# Patient Record
Sex: Female | Born: 1970 | Race: White | Hispanic: No | Marital: Single | Smoking: Former smoker
Health system: Southern US, Community
[De-identification: ages and names within clinical notes are randomized; demographics above are authoritative.]

## PROBLEM LIST (undated history)

## (undated) DIAGNOSIS — F32A Depression, unspecified: Secondary | ICD-10-CM

## (undated) DIAGNOSIS — I10 Essential (primary) hypertension: Secondary | ICD-10-CM

## (undated) DIAGNOSIS — E119 Type 2 diabetes mellitus without complications: Secondary | ICD-10-CM

## (undated) DIAGNOSIS — F329 Major depressive disorder, single episode, unspecified: Secondary | ICD-10-CM

## (undated) DIAGNOSIS — E7800 Pure hypercholesterolemia, unspecified: Secondary | ICD-10-CM

## (undated) HISTORY — PX: CHOLECYSTECTOMY: SHX55

## (undated) HISTORY — PX: HERNIA REPAIR: SHX51

---

## 2006-07-06 ENCOUNTER — Emergency Department: Payer: Self-pay

## 2008-07-10 ENCOUNTER — Emergency Department: Payer: Self-pay | Admitting: Internal Medicine

## 2009-01-18 ENCOUNTER — Emergency Department: Payer: Self-pay | Admitting: Emergency Medicine

## 2009-03-12 ENCOUNTER — Emergency Department: Payer: Self-pay | Admitting: Emergency Medicine

## 2009-05-21 ENCOUNTER — Emergency Department: Payer: Self-pay | Admitting: Unknown Physician Specialty

## 2009-07-04 ENCOUNTER — Emergency Department: Payer: Self-pay | Admitting: Emergency Medicine

## 2009-10-29 ENCOUNTER — Emergency Department: Payer: Self-pay | Admitting: Emergency Medicine

## 2009-12-01 ENCOUNTER — Ambulatory Visit: Payer: Self-pay | Admitting: Family Medicine

## 2010-01-13 ENCOUNTER — Ambulatory Visit: Payer: Self-pay | Admitting: Emergency Medicine

## 2010-03-25 ENCOUNTER — Ambulatory Visit: Payer: Self-pay | Admitting: Cardiovascular Disease

## 2010-11-24 ENCOUNTER — Emergency Department: Payer: Self-pay | Admitting: Emergency Medicine

## 2011-01-08 ENCOUNTER — Emergency Department: Payer: Self-pay | Admitting: Emergency Medicine

## 2011-05-10 ENCOUNTER — Emergency Department: Payer: Self-pay | Admitting: Unknown Physician Specialty

## 2011-05-11 LAB — URINALYSIS, COMPLETE
Bacteria: NONE SEEN
Blood: NEGATIVE
Glucose,UR: NEGATIVE mg/dL (ref 0–75)
Leukocyte Esterase: NEGATIVE
Nitrite: NEGATIVE
Protein: NEGATIVE
RBC,UR: 1 /HPF (ref 0–5)
Specific Gravity: 1.03 (ref 1.003–1.030)
Squamous Epithelial: 1
WBC UR: <1 /HPF (ref 0–5)

## 2011-05-11 LAB — CBC
HCT: 38.7 % (ref 35.0–47.0)
MCV: 70 fL — ABNORMAL LOW (ref 80–100)
Platelet: 325 10*3/uL (ref 150–440)
RBC: 5.54 10*6/uL — ABNORMAL HIGH (ref 3.80–5.20)
RDW: 19.9 % — ABNORMAL HIGH (ref 11.5–14.5)

## 2011-05-11 LAB — COMPREHENSIVE METABOLIC PANEL
Anion Gap: 14 (ref 7–16)
Calcium, Total: 8.8 mg/dL (ref 8.5–10.1)
Creatinine: 0.56 mg/dL — ABNORMAL LOW (ref 0.60–1.30)
EGFR (African American): >60
EGFR (Non-African Amer.): >60
Glucose: 94 mg/dL (ref 65–99)
Potassium: 4 mmol/L (ref 3.5–5.1)
SGOT(AST): 24 U/L (ref 15–37)
SGPT (ALT): 18 U/L

## 2011-05-11 LAB — TROPONIN I: Troponin-I: <0.02 ng/mL

## 2011-05-11 LAB — MAGNESIUM: Magnesium: 1.3 mg/dL — ABNORMAL LOW

## 2011-10-24 ENCOUNTER — Emergency Department: Payer: Self-pay | Admitting: Emergency Medicine

## 2011-10-24 LAB — BASIC METABOLIC PANEL
Anion Gap: 10 (ref 7–16)
BUN: 14 mg/dL (ref 7–18)
Chloride: 108 mmol/L — ABNORMAL HIGH (ref 98–107)
Co2: 22 mmol/L (ref 21–32)
Creatinine: 0.61 mg/dL (ref 0.60–1.30)
EGFR (African American): >60
EGFR (Non-African Amer.): >60
Glucose: 126 mg/dL — ABNORMAL HIGH (ref 65–99)
Osmolality: 281 (ref 275–301)
Potassium: 3.8 mmol/L (ref 3.5–5.1)
Sodium: 140 mmol/L (ref 136–145)

## 2011-10-24 LAB — CBC
HGB: 11.4 g/dL — ABNORMAL LOW (ref 12.0–16.0)
MCH: 24.7 pg — ABNORMAL LOW (ref 26.0–34.0)
MCHC: 32.7 g/dL (ref 32.0–36.0)
MCV: 75 fL — ABNORMAL LOW (ref 80–100)
RBC: 4.6 10*6/uL (ref 3.80–5.20)
RDW: 17.2 % — ABNORMAL HIGH (ref 11.5–14.5)
WBC: 12 10*3/uL — ABNORMAL HIGH (ref 3.6–11.0)

## 2011-10-24 LAB — TROPONIN I: Troponin-I: <0.02 ng/mL

## 2011-10-24 LAB — CK TOTAL AND CKMB (NOT AT ARMC): CK, Total: 105 U/L (ref 21–215)

## 2013-07-10 ENCOUNTER — Emergency Department: Payer: Self-pay | Admitting: Emergency Medicine

## 2013-07-10 LAB — CBC WITH DIFFERENTIAL/PLATELET
Basophil #: 0 10*3/uL (ref 0.0–0.1)
Basophil %: 0.4 %
EOS ABS: 0.3 10*3/uL (ref 0.0–0.7)
Eosinophil %: 2.4 %
HCT: 36 % (ref 35.0–47.0)
HGB: 11.4 g/dL — ABNORMAL LOW (ref 12.0–16.0)
Lymphocyte #: 2.4 10*3/uL (ref 1.0–3.6)
Lymphocyte %: 23 %
MCH: 23.9 pg — ABNORMAL LOW (ref 26.0–34.0)
MCHC: 31.6 g/dL — ABNORMAL LOW (ref 32.0–36.0)
MCV: 76 fL — AB (ref 80–100)
Monocyte #: 0.5 x10 3/mm (ref 0.2–0.9)
Monocyte %: 4.4 %
Neutrophil #: 7.3 10*3/uL — ABNORMAL HIGH (ref 1.4–6.5)
Neutrophil %: 69.8 %
Platelet: 358 10*3/uL (ref 150–440)
RBC: 4.75 10*6/uL (ref 3.80–5.20)
RDW: 18.5 % — AB (ref 11.5–14.5)
WBC: 10.5 10*3/uL (ref 3.6–11.0)

## 2013-07-10 LAB — COMPREHENSIVE METABOLIC PANEL
ALK PHOS: 65 U/L
AST: 12 U/L — AB (ref 15–37)
Albumin: 3.2 g/dL — ABNORMAL LOW (ref 3.4–5.0)
Anion Gap: 8 (ref 7–16)
BUN: 10 mg/dL (ref 7–18)
Bilirubin,Total: 0.1 mg/dL — ABNORMAL LOW (ref 0.2–1.0)
CHLORIDE: 103 mmol/L (ref 98–107)
Calcium, Total: 8.7 mg/dL (ref 8.5–10.1)
Co2: 26 mmol/L (ref 21–32)
Creatinine: 0.57 mg/dL — ABNORMAL LOW (ref 0.60–1.30)
EGFR (African American): >60
EGFR (Non-African Amer.): >60
Glucose: 169 mg/dL — ABNORMAL HIGH (ref 65–99)
Osmolality: 277 (ref 275–301)
POTASSIUM: 3.4 mmol/L — AB (ref 3.5–5.1)
SGPT (ALT): 16 U/L (ref 12–78)
SODIUM: 137 mmol/L (ref 136–145)
Total Protein: 7.2 g/dL (ref 6.4–8.2)

## 2013-07-10 LAB — D-DIMER(ARMC): D-Dimer: 437 ng/ml

## 2013-07-11 LAB — URINALYSIS, COMPLETE
BILIRUBIN, UR: NEGATIVE
GLUCOSE, UR: NEGATIVE mg/dL (ref 0–75)
KETONE: NEGATIVE
Leukocyte Esterase: NEGATIVE
Nitrite: NEGATIVE
PH: 5 (ref 4.5–8.0)
Protein: NEGATIVE
RBC,UR: 1 /HPF (ref 0–5)
Specific Gravity: 1.027 (ref 1.003–1.030)
Squamous Epithelial: 2
WBC UR: 2 /HPF (ref 0–5)

## 2014-03-20 ENCOUNTER — Emergency Department: Payer: Self-pay | Admitting: Emergency Medicine

## 2014-08-01 ENCOUNTER — Emergency Department
Admission: EM | Admit: 2014-08-01 | Discharge: 2014-08-01 | Disposition: A | Discharge status: Home / Self Care | Payer: Self-pay | Attending: Student | Admitting: Student

## 2014-08-01 ENCOUNTER — Encounter: Payer: Self-pay | Admitting: Emergency Medicine

## 2014-08-01 DIAGNOSIS — E119 Type 2 diabetes mellitus without complications: Secondary | ICD-10-CM | POA: Insufficient documentation

## 2014-08-01 DIAGNOSIS — K0889 Other specified disorders of teeth and supporting structures: Secondary | ICD-10-CM

## 2014-08-01 DIAGNOSIS — Z87891 Personal history of nicotine dependence: Secondary | ICD-10-CM | POA: Insufficient documentation

## 2014-08-01 DIAGNOSIS — K088 Other specified disorders of teeth and supporting structures: Secondary | ICD-10-CM | POA: Insufficient documentation

## 2014-08-01 DIAGNOSIS — I10 Essential (primary) hypertension: Secondary | ICD-10-CM | POA: Insufficient documentation

## 2014-08-01 HISTORY — DX: Pure hypercholesterolemia, unspecified: E78.00

## 2014-08-01 HISTORY — DX: Depression, unspecified: F32.A

## 2014-08-01 HISTORY — DX: Type 2 diabetes mellitus without complications: E11.9

## 2014-08-01 HISTORY — DX: Major depressive disorder, single episode, unspecified: F32.9

## 2014-08-01 HISTORY — DX: Essential (primary) hypertension: I10

## 2014-08-01 MED ORDER — KETOROLAC TROMETHAMINE 60 MG/2ML IM SOLN
60.0000 mg | Freq: Once | INTRAMUSCULAR | Status: AC
  Administered 2014-08-01: 60 mg via INTRAMUSCULAR

## 2014-08-01 MED ORDER — KETOROLAC TROMETHAMINE 60 MG/2ML IM SOLN
INTRAMUSCULAR | Status: AC
  Administered 2014-08-01: 60 mg via INTRAMUSCULAR
  Filled 2014-08-01: qty 2

## 2014-08-01 MED ORDER — AMOXICILLIN 500 MG PO CAPS
ORAL_CAPSULE | ORAL | Status: AC
  Filled 2014-08-01: qty 1

## 2014-08-01 MED ORDER — HYDROCODONE-ACETAMINOPHEN 5-325 MG PO TABS: 1.0000 | ORAL_TABLET | ORAL | Status: DC | PRN

## 2014-08-01 MED ORDER — AMOXICILLIN 500 MG PO CAPS: 500.0000 mg | ORAL_CAPSULE | Freq: Three times a day (TID) | ORAL | Status: DC

## 2014-08-01 MED ORDER — AMOXICILLIN 500 MG PO CAPS
500.0000 mg | ORAL_CAPSULE | Freq: Once | ORAL | Status: AC
  Administered 2014-08-01: 500 mg via ORAL

## 2014-08-01 NOTE — ED Provider Notes (Signed)
[facility] Emergency Department Provider Note  ____________________________________________  Time seen: Approximately 8:38 PM  I have reviewed the triage vital signs and the nursing notes.   HISTORY  Chief Complaint Dental Pain   HPI Destiny Anderson is a 44 y.o. female presents to the emergency department for dental pain. She's had pain for the past 2 days, and was unable to get in with her dentist today. She has been taking ibuprofen without relief. Pain is 10 on 10.   Past Medical History  Diagnosis Date  . Hypertension   . Diabetes mellitus without complication   . Hypercholesteremia   . Depression     There are no active problems to display for this patient.   History reviewed. No pertinent past surgical history.  Current Outpatient Rx  Name  Route  Sig  Dispense  Refill  . amoxicillin (AMOXIL) 500 MG capsule   Oral   Take 1 capsule (500 mg total) by mouth 3 (three) times daily.   30 capsule   0   . HYDROcodone-acetaminophen (NORCO/VICODIN) 5-325 MG per tablet   Oral   Take 1 tablet by mouth every 4 (four) hours as needed for moderate pain.   6 tablet   0     Allergies Review of patient's allergies indicates no known allergies.  History reviewed. No pertinent family history.  Social History History  Substance Use Topics  . Smoking status: Former Research scientist (life sciences)  . Smokeless tobacco: Never Used  . Alcohol Use: No    Review of Systems Constitutional: No fever/chills Eyes: No visual changes. ENT: No sore throat. Cardiovascular: Denies chest pain. Respiratory: Denies shortness of breath. Gastrointestinal: No abdominal pain.  No nausea, no vomiting.  Genitourinary: Negative for dysuria. Musculoskeletal: Negative for back pain. Skin: Negative for rash. Neurological: Negative for headaches, focal weakness or numbness. 10-point ROS otherwise negative.  ____________________________________________   PHYSICAL EXAM:  VITAL  SIGNS: ED Triage Vitals  Enc Vitals Group     BP 08/01/14 2002 147/124 mmHg     Pulse Rate 08/01/14 2002 94     Resp 08/01/14 2002 18     Temp 08/01/14 2002 99.1 F (37.3 C)     Temp Source 08/01/14 2002 Oral     SpO2 08/01/14 2002 100 %     Weight 08/01/14 2002 320 lb (145.151 kg)     Height 08/01/14 2002 5\' 3"  (1.6 m)     Head Cir --      Peak Flow --      Pain Score 08/01/14 2002 10     Pain Loc --      Pain Edu? --      Excl. in [place]? --     Constitutional: Alert and oriented. Well appearing and in no acute distress. Eyes: Conjunctivae are normal. PERRL. EOMI. Head: Atraumatic. Nose: No congestion/rhinnorhea. Mouth/Throat: Mucous membranes are moist.  Oropharynx non-erythematous. Periodontal Exam    Neck: No stridor.  Hematological/Lymphatic/Immunilogical: No cervical lymphadenopathy. Cardiovascular:   Good peripheral circulation. Respiratory: Normal respiratory effort.  No retractions. Musculoskeletal: No lower extremity tenderness nor edema.  No joint effusions. Neurologic:  Normal speech and language. No gross focal neurologic deficits are appreciated. Speech is normal. No gait instability. Skin:  Skin is warm, dry and intact. No rash noted. Psychiatric: Mood and affect are normal. Speech and behavior are normal.  ____________________________________________   LABS (all labs ordered are listed, but only abnormal results are displayed)  Labs Reviewed - No data to display ____________________________________________  RADIOLOGY  Not indicated ____________________________________________   PROCEDURES  Procedure(s) performed: None  Critical Care performed: No  ____________________________________________   INITIAL IMPRESSION / ASSESSMENT AND PLAN / ED COURSE  Pertinent labs & imaging results that were available during my care of the patient were reviewed by me and considered in my medical decision making (see chart for details).  Patient was advised  to see the dentist within 14 days. Also advised to take the antibiotic until finished. Instructed to return to the ER for symptoms that change or worsen if you are unable to schedule an appointment. ____________________________________________   FINAL CLINICAL IMPRESSION(S) / ED DIAGNOSES  Final diagnoses:  Pain, dental       Victorino Dike, FNP 08/01/14 2041  Joanne Gavel, MD 08/02/14 0005

## 2014-08-01 NOTE — Discharge Instructions (Signed)
Dental Pain A tooth ache may be caused by cavities (tooth decay). Cavities expose the nerve of the tooth to air and hot or cold temperatures. It may come from an infection or abscess (also called a boil or furuncle) around your tooth. It is also often caused by dental caries (tooth decay). This causes the pain you are having. DIAGNOSIS  Your caregiver can diagnose this problem by exam. TREATMENT   If caused by an infection, it may be treated with medications which kill germs (antibiotics) and pain medications as prescribed by your caregiver. Take medications as directed.  Only take over-the-counter or prescription medicines for pain, discomfort, or fever as directed by your caregiver.  Whether the tooth ache today is caused by infection or dental disease, you should see your dentist as soon as possible for further care. SEEK MEDICAL CARE IF: The exam and treatment you received today has been provided on an emergency basis only. This is not a substitute for complete medical or dental care. If your problem worsens or new problems (symptoms) appear, and you are unable to meet with your dentist, call or return to this location. SEEK IMMEDIATE MEDICAL CARE IF:   You have a fever.  You develop redness and swelling of your face, jaw, or neck.  You are unable to open your mouth.  You have severe pain uncontrolled by pain medicine. MAKE SURE YOU:   Understand these instructions.  Will watch your condition.  Will get help right away if you are not doing well or get worse. Document Released: 01/25/2005 Document Revised: 04/19/2011 Document Reviewed: 09/13/2007 [facility] Patient Information 2015 [place], [place]. This information is not intended to replace advice given to you by your health care provider. Make sure you discuss any questions you have with your health care provider. OPTIONS FOR DENTAL FOLLOW UP CARE  [place] [facility] and [place] [email or web address] [phone])  [facility] [phone])  [place] [phone])  [place] [phone])  [facility] [phone]) This clinic caters to the indigent population and is on a lottery system. Location: [employer] [facility], [place], [place], [place] Clinic Hours: Wednesdays from 6pm - 9pm, patients seen by a lottery system. For dates, call or go to [email or web address] Services: Cleanings, fillings and simple extractions. Payment Options: DENTAL WORK IS FREE OF CHARGE. Bring proof of income or support. Best way to get seen: Arrive at 5:15 pm - this is a lottery, NOT first come/first serve, so arriving earlier will not increase your chances of being seen.     [facility] [phone] Select option 1 for emergencies   Location: [facility], [place], [address], [place] Clinic Hours: No walk-ins accepted - call the day before to schedule an appointment. Check in times are 9:30 am and 1:30 pm. Services: Simple extractions, temporary fillings, pulpectomy/pulp debridement, uncomplicated abscess drainage. Payment Options: PAYMENT IS DUE AT THE TIME OF SERVICE.  Fee is usually $100-200, additional surgical procedures (e.g. abscess drainage) may be extra. Cash, checks, Visa/MasterCard accepted.  Can file Medicaid if patient is covered for dental - patient should call case worker to check. No discount for [facility] patients. Best way to get seen: MUST call the day before and get onto the schedule. Can usually be seen the next 1-2 days. No walk-ins accepted.     [facility] [phone]   Location: [place], [place] Clinic Hours: M, W, Th, F 8am or 1:30pm, Tues  9a or 1:30 - first come/first served. Services: Simple extractions, temporary fillings, uncomplicated abscess drainage.  You do not need to be an [facility] resident. Payment Options: PAYMENT IS DUE AT THE TIME OF SERVICE. Dental insurance, otherwise sliding scale - bring proof of income or support. Depending on income and treatment needed, cost is usually $50-200. Best way to get seen: Arrive early as it is first come/first served.     [facility] [phone]   Location: [place] [facility] Hours: Mon-Thu 8a-5p Services: Most basic dental services including extractions and fillings. Payment Options: PAYMENT IS DUE AT THE TIME OF SERVICE. Sliding scale, up to 50% off - bring proof if income or support. Medicaid with dental option accepted. Best way to get seen: Call to schedule an appointment, can usually be seen within 2 weeks OR they will try to see walk-ins - show up at [place] or 2p (you may have to wait).     [facility] [place] RESIDENTS ONLY   Location: [facility], [place] [address] [place], [place] [address] Clinic Hours: By appointment only. Monday - Thursday 8am-5pm, Friday 8am-12pm Services: Cleanings, fillings, extractions. Payment Options: PAYMENT IS DUE AT THE TIME OF SERVICE. Cash, Visa or MasterCard. Sliding scale - $30 minimum per service. Best way to get seen: Come in to office, complete packet and make an appointment - need proof of income or support monies for each household member and proof of [facility] residence. Usually takes about a month to get in.     [facility] [phone]   Location: [address] [place] Clinic Hours: Walk-in Urgent Care Dental Services are offered Monday-Friday mornings only. The numbers of emergencies accepted daily is limited to the number of providers available. Maximum 15 -  Mondays, Wednesdays & Thursdays Maximum 10 - Tuesdays & Fridays Services: You do not need to be a [facility] resident to be seen for a dental emergency. Emergencies are defined as pain, swelling, abnormal bleeding, or dental trauma. Walkins will receive x-rays if needed. NOTE: Dental cleaning is not an emergency. Payment Options: PAYMENT IS DUE AT THE TIME OF SERVICE. Minimum co-pay is $40.00 for uninsured patients. Minimum co-pay is $3.00 for Medicaid with dental coverage. Dental Insurance is accepted and must be presented at time of visit. Medicare does not cover dental. Forms of payment: Cash, credit card, checks. Best way to get seen: If not previously registered with the clinic, walk-in dental registration begins at 7:15 am and is on a first come/first serve basis. If previously registered with the clinic, call to make an appointment.     [facility] [place] ONLY   Location: [address], [place], [place] Clinic Hours: Mon-Thu 10a-2p Services: Extractions only! Payment Options: FREE (donations accepted) - bring proof of income or support Best way to get seen: Call and schedule an appointment OR come at 8am on the 1st Monday of every month (except for holidays) when it is first come/first served.     [facility] [phone]   Location: [place], [place] Clinic Hours: Friday mornings Services, Payment Options, Best way to get seen: Call for info

## 2014-08-01 NOTE — ED Notes (Signed)
Pt reports dental pain x 2.5 days.  Pain to bottom left molar.  Pt NAD at this time.

## 2014-10-22 ENCOUNTER — Emergency Department
Admission: EM | Admit: 2014-10-22 | Discharge: 2014-10-22 | Disposition: A | Discharge status: Home / Self Care | Payer: Self-pay | Attending: Emergency Medicine | Admitting: Emergency Medicine

## 2014-10-22 ENCOUNTER — Emergency Department: Payer: Self-pay

## 2014-10-22 DIAGNOSIS — R5383 Other fatigue: Secondary | ICD-10-CM | POA: Insufficient documentation

## 2014-10-22 DIAGNOSIS — R0789 Other chest pain: Secondary | ICD-10-CM | POA: Insufficient documentation

## 2014-10-22 DIAGNOSIS — I10 Essential (primary) hypertension: Secondary | ICD-10-CM | POA: Insufficient documentation

## 2014-10-22 DIAGNOSIS — Z792 Long term (current) use of antibiotics: Secondary | ICD-10-CM | POA: Insufficient documentation

## 2014-10-22 DIAGNOSIS — E119 Type 2 diabetes mellitus without complications: Secondary | ICD-10-CM | POA: Insufficient documentation

## 2014-10-22 DIAGNOSIS — Z87891 Personal history of nicotine dependence: Secondary | ICD-10-CM | POA: Insufficient documentation

## 2014-10-22 LAB — CBC
HCT: 33.6 % — ABNORMAL LOW (ref 35.0–47.0)
Hemoglobin: 10.6 g/dL — ABNORMAL LOW (ref 12.0–16.0)
MCH: 23.3 pg — ABNORMAL LOW (ref 26.0–34.0)
MCHC: 31.4 g/dL — ABNORMAL LOW (ref 32.0–36.0)
MCV: 74.3 fL — ABNORMAL LOW (ref 80.0–100.0)
PLATELETS: 374 10*3/uL (ref 150–440)
RBC: 4.53 MIL/uL (ref 3.80–5.20)
RDW: 16.8 % — ABNORMAL HIGH (ref 11.5–14.5)
WBC: 9.7 10*3/uL (ref 3.6–11.0)

## 2014-10-22 LAB — BASIC METABOLIC PANEL
Anion gap: 9 (ref 5–15)
BUN: 8 mg/dL (ref 6–20)
CALCIUM: 9 mg/dL (ref 8.9–10.3)
CO2: 26 mmol/L (ref 22–32)
CREATININE: 0.55 mg/dL (ref 0.44–1.00)
Chloride: 100 mmol/L — ABNORMAL LOW (ref 101–111)
Glucose, Bld: 173 mg/dL — ABNORMAL HIGH (ref 65–99)
Potassium: 3.4 mmol/L — ABNORMAL LOW (ref 3.5–5.1)
SODIUM: 135 mmol/L (ref 135–145)

## 2014-10-22 LAB — TROPONIN I

## 2014-10-22 NOTE — ED Provider Notes (Signed)
[facility] Emergency Department Provider Note  ____________________________________________  Time seen: 1710  I have reviewed the triage vital signs and the nursing notes.   HISTORY  Chief Complaint Chest Pain and Fatigue     HPI Destiny Anderson is a 44 y.o. female who was sitting at her desk today without exertion and she all of a sudden felt some form of a pop in her chest/sternal area. When this pop occurred she started to feel a strange sensation that spread down through her body and down through her legs. She feels better now without any ongoing symptoms except mild discomfort in her upper chest. This discomfort in the upper chest has been present for a few days.  The patient reports having a similar sensation of a pop in her abdomen once before. This is lower down in the abdomen and she was coughing when that occurred.  The patient reports she does have a hiatal hernia and had a umbilical hernia repair in the past.  In addition to this issue of a pop and odd sensation, the patient reports that she has had decreased tolerance for food over the past 4 days. She reports she feels full earlier than usual.    Past Medical History  Diagnosis Date  . Hypertension   . Diabetes mellitus without complication   . Hypercholesteremia   . Depression     There are no active problems to display for this patient.   Past Surgical History  Procedure Laterality Date  . Cesarean section      x4  . Hernia repair    . Cholecystectomy      Current Outpatient Rx  Name  Route  Sig  Dispense  Refill  . amoxicillin (AMOXIL) 500 MG capsule   Oral   Take 1 capsule (500 mg total) by mouth 3 (three) times daily.   30 capsule   0   . HYDROcodone-acetaminophen (NORCO/VICODIN) 5-325 MG per tablet   Oral   Take 1 tablet by mouth every 4 (four) hours as needed for moderate pain.   6 tablet   0     Allergies Review of patient's allergies indicates no known  allergies.  No family history on file.  Social History Social History  Substance Use Topics  . Smoking status: Former Research scientist (life sciences)  . Smokeless tobacco: Never Used     Comment: uses e-cig  . Alcohol Use: No    Review of Systems  Constitutional: Negative for fever. ENT: Negative for sore throat. Cardiovascular: See history of present illness regarding the pop in her chest. Respiratory: Negative for shortness of breath. Gastrointestinal: Negative for abdominal pain, vomiting and diarrhea. Genitourinary: Negative for dysuria. Musculoskeletal: No myalgias or injuries. Skin: Negative for rash. Neurological: Negative for headaches   10-point ROS otherwise negative.  ____________________________________________   PHYSICAL EXAM:  VITAL SIGNS: ED Triage Vitals  Enc Vitals Group     BP 10/22/14 1413 153/86 mmHg     Pulse Rate 10/22/14 1413 90     Resp 10/22/14 1413 18     Temp 10/22/14 1413 98.5 F (36.9 C)     Temp Source 10/22/14 1413 Oral     SpO2 10/22/14 1413 97 %     Weight 10/22/14 1413 315 lb (142.883 kg)     Height 10/22/14 1413 5\' 2"  (1.575 m)     Head Cir --      Peak Flow --      Pain Score --  Pain Loc --      Pain Edu? --      Excl. in [place]? --     Constitutional: Alert and oriented. Well appearing and in no distress. Large body habitus. ENT   Head: Normocephalic and atraumatic.   Nose: No congestion/rhinnorhea.   Mouth/Throat: Mucous membranes are moist. Cardiovascular: Normal rate, regular rhythm, no murmur noted Chest wall: Mild tenderness in the upper chest. No particular tenderness down through the sternum. No boggy tissue or erythema. Respiratory:  Normal respiratory effort, no tachypnea.    Breath sounds are clear and equal bilaterally.  Gastrointestinal: Soft. Minimal discomfort on exam of the upper abdomen.. No distention.  Back: No muscle spasm, no tenderness, no CVA tenderness. Musculoskeletal: No deformity noted. Nontender with  normal range of motion in all extremities.  No noted edema. Neurologic:  Normal speech and language. No gross focal neurologic deficits are appreciated.  Skin:  Skin is warm, dry. No rash noted. Psychiatric: Mood and affect are normal. Speech and behavior are normal.  ____________________________________________    LABS (pertinent positives/negatives)  Labs Reviewed  BASIC METABOLIC PANEL - Abnormal; Notable for the following:    Potassium 3.4 (*)    Chloride 100 (*)    Glucose, Bld 173 (*)    All other components within normal limits  CBC - Abnormal; Notable for the following:    Hemoglobin 10.6 (*)    HCT 33.6 (*)    MCV 74.3 (*)    MCH 23.3 (*)    MCHC 31.4 (*)    RDW 16.8 (*)    All other components within normal limits  TROPONIN I     ____________________________________________   EKG  ED ECG REPORT I, Jordi Kamm W, the attending physician, personally viewed and interpreted this ECG.   Date: 10/22/2014  EKG Time: 1430  Rate: 89  Rhythm: Normal sinus rhythm  Axis: Normal  Intervals: Normal  ST&T Change: None noted   ____________________________________________    RADIOLOGY  Chest x-ray: No acute changes ____________________________________________  INITIAL IMPRESSION / ASSESSMENT AND PLAN / ED COURSE  Pertinent labs & imaging results that were available during my care of the patient were reviewed by me and considered in my medical decision making (see chart for details).  Pleasant 44 year old female in no acute distress. She is obese and we have discussed her weight, sugar control and diabetes, and the long differential diagnosis of what may have caused this pop and discomfort today. She is in no acute distress now. The chest x-ray is normal, the EKG is reasonable, and her blood tests are nondiagnostic.  I've asked her to follow-up with her primary physician at [facility] clinic.  ____________________________________________   FINAL CLINICAL  IMPRESSION(S) / ED DIAGNOSES  Final diagnoses:  Chest wall tenderness      Ahmed Prima, MD 10/22/14 1734

## 2014-10-22 NOTE — ED Notes (Signed)
She reports midsternal cp that radiated down into her abdomen and she reports that it felt cold, tingling, numbness that went all the way down

## 2014-10-22 NOTE — Discharge Instructions (Signed)
Is unclear what caused the popping in your chest today. We spoke about a broad range of items that could have caused this, including popping of the sternum, your hiatal hernia, bowel obstruction or partial obstruction, esophageal issues, and soft tissue issues. At this time, with the feeling well and with a normal chest x-ray and normal blood tests, it is reasonable for you to return home but to return to the emergency department if you have another episode or increased pain or any other urgent concerns. Follow-up with her primary physician as scheduled or sooner.  Chest Wall Pain Chest wall pain is pain in or around the bones and muscles of your chest. It may take up to 6 weeks to get better. It may take longer if you must stay physically active in your work and activities.  CAUSES  Chest wall pain may happen on its own. However, it may be caused by:  A viral illness like the flu.  Injury.  Coughing.  Exercise.  Arthritis.  Fibromyalgia.  Shingles. HOME CARE INSTRUCTIONS   Avoid overtiring physical activity. Try not to strain or perform activities that cause pain. This includes any activities using your chest or your abdominal and side muscles, especially if heavy weights are used.  Put ice on the sore area.  Put ice in a plastic bag.  Place a towel between your skin and the bag.  Leave the ice on for 15-20 minutes per hour while awake for the first 2 days.  Only take over-the-counter or prescription medicines for pain, discomfort, or fever as directed by your caregiver. SEEK IMMEDIATE MEDICAL CARE IF:   Your pain increases, or you are very uncomfortable.  You have a fever.  Your chest pain becomes worse.  You have new, unexplained symptoms.  You have nausea or vomiting.  You feel sweaty or lightheaded.  You have a cough with phlegm (sputum), or you cough up blood. MAKE SURE YOU:   Understand these instructions.  Will watch your condition.  Will get help right  away if you are not doing well or get worse. Document Released: 01/25/2005 Document Revised: 04/19/2011 Document Reviewed: 09/21/2010 [facility] Patient Information 2015 [place], [place]. This information is not intended to replace advice given to you by your health care provider. Make sure you discuss any questions you have with your health care provider.

## 2014-10-22 NOTE — ED Notes (Signed)
Pt states she was sitting at her desk today and felt a "pop" in her chest with heaviness, states she has had increased fatigue over the past week.the patient is in NAD on arrival, respirations WNL, skin is warm and dry.Marland Kitchen

## 2016-10-08 ENCOUNTER — Encounter: Payer: Self-pay | Admitting: Emergency Medicine

## 2016-10-08 ENCOUNTER — Emergency Department: Payer: Self-pay

## 2016-10-08 DIAGNOSIS — Z87891 Personal history of nicotine dependence: Secondary | ICD-10-CM | POA: Insufficient documentation

## 2016-10-08 DIAGNOSIS — Z794 Long term (current) use of insulin: Secondary | ICD-10-CM | POA: Insufficient documentation

## 2016-10-08 DIAGNOSIS — I10 Essential (primary) hypertension: Secondary | ICD-10-CM | POA: Insufficient documentation

## 2016-10-08 DIAGNOSIS — Z79899 Other long term (current) drug therapy: Secondary | ICD-10-CM | POA: Insufficient documentation

## 2016-10-08 DIAGNOSIS — M25561 Pain in right knee: Secondary | ICD-10-CM | POA: Insufficient documentation

## 2016-10-08 DIAGNOSIS — E119 Type 2 diabetes mellitus without complications: Secondary | ICD-10-CM | POA: Insufficient documentation

## 2016-10-08 DIAGNOSIS — M79651 Pain in right thigh: Secondary | ICD-10-CM | POA: Insufficient documentation

## 2016-10-08 NOTE — ED Notes (Signed)
Patient transported to Ultrasound 

## 2016-10-08 NOTE — ED Triage Notes (Signed)
Patient to stat desk asking about update on wait time. Patient given update.

## 2016-10-08 NOTE — ED Triage Notes (Signed)
Pt ambulatory with slow gait, favoring the right leg. Pt c/o right posterior knee pain and upper thigh swelling x3 weeks. 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 Area is not hot to the touch, nor reddened.

## 2016-10-09 ENCOUNTER — Emergency Department
Admission: EM | Admit: 2016-10-09 | Discharge: 2016-10-09 | Disposition: A | Discharge status: Home / Self Care | Payer: Self-pay | Attending: Emergency Medicine | Admitting: Emergency Medicine

## 2016-10-09 DIAGNOSIS — M79604 Pain in right leg: Secondary | ICD-10-CM

## 2016-10-09 LAB — BASIC METABOLIC PANEL
Anion gap: 14 (ref 5–15)
BUN: 9 mg/dL (ref 6–20)
CALCIUM: 8.4 mg/dL — AB (ref 8.9–10.3)
CO2: 18 mmol/L — ABNORMAL LOW (ref 22–32)
CREATININE: 0.63 mg/dL (ref 0.44–1.00)
Chloride: 103 mmol/L (ref 101–111)
GFR calc Af Amer: >60 mL/min (ref >60)
GLUCOSE: 207 mg/dL — AB (ref 65–99)
POTASSIUM: 3.6 mmol/L (ref 3.5–5.1)
SODIUM: 135 mmol/L (ref 135–145)

## 2016-10-09 LAB — CK: Total CK: 101 U/L (ref 38–234)

## 2016-10-09 MED ORDER — OXYCODONE-ACETAMINOPHEN 5-325 MG PO TABS: 1.0000 | ORAL_TABLET | ORAL | 0 refills | Status: DC | PRN

## 2016-10-09 MED ORDER — OXYCODONE-ACETAMINOPHEN 5-325 MG PO TABS
1.0000 | ORAL_TABLET | Freq: Once | ORAL | Status: AC
  Administered 2016-10-09: 1 via ORAL
  Filled 2016-10-09: qty 1

## 2016-10-09 MED ORDER — IBUPROFEN 800 MG PO TABS: 800.0000 mg | ORAL_TABLET | Freq: Three times a day (TID) | ORAL | 0 refills | Status: DC | PRN

## 2016-10-09 NOTE — ED Notes (Signed)
Pt with swelling noted to right lateral mid thigh, area is not red, painful to palpation per pt. No increased warmth felt.

## 2016-10-09 NOTE — ED Provider Notes (Signed)
[facility] Emergency Department Provider Note   ____________________________________________   First MD Initiated Contact with Patient 10/09/16 0034     (approximate)  I have reviewed the triage vital signs and the nursing notes.   HISTORY  Chief Complaint Knee Pain and Leg Swelling    HPI Destiny Anderson is a 46 y.o. female who presents to the ED from home with a chief complaint of nontraumatic right leg pain. Patient reports a three-week history of pain which migrates behind her right knee and upper thigh.Also notes swelling to her right thigh. 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 Denies associated fever, chills, chest pain,shortness of breath, abdominal pain, nausea, vomiting, extremity weakness, numbnesss/tingling. Denies recent travel or trauma. Putting weight on her leg makes it feel slightly better. Elevation and dangling make it worse. Ibuprofen and gabapentin does not alleviate pain.   Past Medical History:  Diagnosis Date  . Depression   . Diabetes mellitus without complication ([place])   . Hypercholesteremia   . Hypertension     There are no active problems to display for this patient.   Past Surgical History:  Procedure Laterality Date  . CESAREAN SECTION     x4  . CHOLECYSTECTOMY    . HERNIA REPAIR      Prior to Admission medications   Medication Sig Start Date End Date Taking? Authorizing Provider  Ascorbic Acid (VITAMIN C) 1000 MG tablet Take 1,000 mg by mouth daily.   Yes [provider]  B Complex-C (B-COMPLEX WITH VITAMIN C) tablet Take 1 tablet by mouth daily.   Yes [provider]  esomeprazole (NEXIUM) 40 MG capsule Take 40 mg by mouth daily at 12 noon.   Yes [provider]  ferrous sulfate 325 (65 FE) MG EC tablet Take 325 mg by mouth daily with breakfast.   Yes [provider]  gabapentin (NEURONTIN) 300 MG capsule Take 300 mg by mouth 2 (two)  times daily.   Yes [provider]  insulin aspart (NOVOLOG) 100 UNIT/ML injection Inject 15 Units into the skin 2 (two) times daily.   Yes [provider]  insulin detemir (LEVEMIR) 100 UNIT/ML injection Inject 53 Units into the skin 2 (two) times daily.   Yes [provider]  linagliptin (TRADJENTA) 5 MG TABS tablet Take 5 mg by mouth daily.   Yes [provider]  lisinopril-hydrochlorothiazide (PRINZIDE,ZESTORETIC) 20-12.5 MG tablet Take 1 tablet by mouth daily.   Yes [provider]  metFORMIN (GLUCOPHAGE) 1000 MG tablet Take 1,000 mg by mouth 2 (two) times daily with a meal.   Yes [provider]  metoprolol succinate (TOPROL-XL) 25 MG 24 hr tablet Take 25 mg by mouth daily.   Yes [provider]  omeprazole (PRILOSEC) 40 MG capsule Take 40 mg by mouth daily.   Yes [provider]  pravastatin (PRAVACHOL) 40 MG tablet Take 40 mg by mouth daily.   Yes [provider]  venlafaxine XR (EFFEXOR-XR) 150 MG 24 hr capsule Take 150 mg by mouth daily with breakfast.   Yes [provider]  ibuprofen (ADVIL,MOTRIN) 800 MG tablet Take 1 tablet (800 mg total) by mouth every 8 (eight) hours as needed for moderate pain. 10/09/16   Paulette Blanch, MD  oxyCODONE-acetaminophen (ROXICET) 5-325 MG tablet Take 1 tablet by mouth every 4 (four) hours as needed for severe pain. 10/09/16   Paulette Blanch, MD    Allergies  Patient has no known allergies.  History reviewed. No pertinent family history.  Social History Social History  Substance Use Topics  . Smoking status: Former Research scientist (life sciences)  . Smokeless tobacco: Never Used     Comment: uses e-cig  . Alcohol use No    Review of Systems  Constitutional: No fever/chills. Eyes: No visual changes. ENT: No sore throat. Cardiovascular: Denies chest pain. Respiratory: Denies shortness of breath. Gastrointestinal: No abdominal pain.  No nausea, no vomiting.  No diarrhea.  No  constipation. Genitourinary: Negative for dysuria. Musculoskeletal: Positive for right leg pain. Negative for back pain. Skin: Negative for rash. Neurological: Negative for headaches, focal weakness or numbness.   ____________________________________________   PHYSICAL EXAM:  VITAL SIGNS: ED Triage Vitals [10/08/16 2114]  Enc Vitals Group     BP (!) 157/87     Pulse Rate 100     Resp 17     Temp 97.8 F (36.6 C)     Temp Source Oral     SpO2 99 %     Weight (!) 315 lb (142.9 kg)     Height      Head Circumference      Peak Flow      Pain Score      Pain Loc      Pain Edu?      Excl. in [place]?     Constitutional: Alert and oriented. Well appearing and in no acute distress. Eyes: Conjunctivae are normal. PERRL. EOMI. Head: Atraumatic. Nose: No congestion/rhinnorhea. Mouth/Throat: Mucous membranes are moist.  Oropharynx non-erythematous. Neck: No stridor.   Cardiovascular: Normal rate, regular rhythm. Grossly normal heart sounds.  Good peripheral circulation. Respiratory: Normal respiratory effort.  No retractions. Lungs CTAB. Gastrointestinal: Soft and nontender. No distention. No abdominal bruits. No CVA tenderness. Musculoskeletal: No appreciation limb swelling compared to left side. Mild point tenderness to right hip without warmth or erythema. FROM right hip without pain. Mild tenderness to palpation right anterior thigh. Mild tenderness to palpation posterior right knee. Calf supple without evidence for compartment syndrome. 2+ distal pulses. Brisk, less than 5 second capillary refill. Symmetrically warm limb without evidence for ischemia. Neurologic:  Normal speech and language. No gross focal neurologic deficits are appreciated.  Skin:  Skin is warm, dry and intact. No rash noted. Psychiatric: Mood and affect are normal. Speech and behavior are normal.  ____________________________________________   LABS (all labs ordered are listed, but only abnormal results are  displayed)  Labs Reviewed  BASIC METABOLIC PANEL - Abnormal; Notable for the following:       Result Value   CO2 18 (*)    Glucose, Bld 207 (*)    Calcium 8.4 (*)    All other components within normal limits  CK   ____________________________________________  EKG  none ____________________________________________  RADIOLOGY  US Venous Img Lower Unilateral Right  Result Date: 10/08/2016 CLINICAL DATA:  Initial evaluation for acute pain behind right knee and upper thigh. EXAM: Right LOWER EXTREMITY VENOUS DOPPLER ULTRASOUND TECHNIQUE: Gray-scale sonography with graded compression, as well as color Doppler and duplex ultrasound were performed to evaluate the lower extremity deep venous systems from the level of the common femoral vein and including the common femoral, femoral, profunda femoral, popliteal and calf veins including the posterior tibial, peroneal and gastrocnemius veins when visible. The superficial great saphenous vein was also interrogated. Spectral Doppler was utilized to evaluate flow at rest and with distal augmentation maneuvers in the common femoral, femoral and popliteal veins. COMPARISON:  None. FINDINGS: Contralateral Common Femoral Vein: Respiratory phasicity is normal and symmetric with the symptomatic side. No evidence of thrombus. Normal compressibility. Common Femoral Vein: No evidence of thrombus. Normal compressibility, respiratory phasicity and response to augmentation. Saphenofemoral Junction: No evidence of thrombus. Normal compressibility and flow on color Doppler imaging. Profunda Femoral Vein: No evidence of thrombus. Normal compressibility and flow on color Doppler imaging. Femoral Vein: No evidence of thrombus. Normal compressibility, respiratory phasicity and response to augmentation. Popliteal Vein: No evidence of thrombus. Normal compressibility, respiratory phasicity and response to augmentation. Calf Veins: No evidence of thrombus. Normal compressibility  and flow on color Doppler imaging. Superficial Great Saphenous Vein: No evidence of thrombus. Normal compressibility and flow on color Doppler imaging. Venous Reflux:  None. Other Findings:  None. IMPRESSION: No evidence of DVT within the right lower extremity. Electronically Signed   By: Jeannine Boga M.D.   On: 10/08/2016 23:56    ____________________________________________   PROCEDURES  Procedure(s) performed: None  Procedures  Critical Care performed: No  ____________________________________________   INITIAL IMPRESSION / ASSESSMENT AND PLAN / ED COURSE  Pertinent labs & imaging results that were available during my care of the patient were reviewed by me and considered in my medical decision making (see chart for details).  46 year old female who presents with a 3 week history of nontraumatic right posterior knee and upper thigh pain. Doppler negative for DVT. Will check metabolic panel, CK, administer analgesia and reassess. Will add low-dose muscle relaxer for occasional muscle cramps.  Clinical Course as of Oct 10 703  Sat Oct 09, 2016  [id number] Updated patient of laboratory results. Pain improved. Strict return precautions given. Patient verbalizes understanding and agrees with plan of care.  [JS]    Clinical Course User Index [JS] Paulette Blanch, MD     ____________________________________________   FINAL CLINICAL IMPRESSION(S) / ED DIAGNOSES  Final diagnoses:  Right leg pain      NEW MEDICATIONS STARTED DURING THIS VISIT:  Discharge Medication List as of 10/09/2016  3:15 AM    START taking these medications   Details  ibuprofen (ADVIL,MOTRIN) 800 MG tablet Take 1 tablet (800 mg total) by mouth every 8 (eight) hours as needed for moderate pain., Starting Sat 10/09/2016, Print    oxyCODONE-acetaminophen (ROXICET) 5-325 MG tablet Take 1 tablet by mouth every 4 (four) hours as needed for severe pain., Starting Sat 10/09/2016, Print         Note:  This  document was prepared using Dragon voice recognition software and may include unintentional dictation errors.    Paulette Blanch, MD 10/09/16 [phone]

## 2016-10-09 NOTE — Discharge Instructions (Signed)
1. Take pain medicines as needed (Motrin/Percocet #15). 2. Return to the ER for worsening symptoms, persistent vomiting, difficulty breathing or other concerns.

## 2017-06-01 ENCOUNTER — Emergency Department: Payer: Self-pay

## 2017-06-01 ENCOUNTER — Encounter: Payer: Self-pay | Admitting: Emergency Medicine

## 2017-06-01 ENCOUNTER — Other Ambulatory Visit: Payer: Self-pay

## 2017-06-01 DIAGNOSIS — Z79899 Other long term (current) drug therapy: Secondary | ICD-10-CM | POA: Insufficient documentation

## 2017-06-01 DIAGNOSIS — Z794 Long term (current) use of insulin: Secondary | ICD-10-CM | POA: Insufficient documentation

## 2017-06-01 DIAGNOSIS — Z87891 Personal history of nicotine dependence: Secondary | ICD-10-CM | POA: Insufficient documentation

## 2017-06-01 DIAGNOSIS — E119 Type 2 diabetes mellitus without complications: Secondary | ICD-10-CM | POA: Insufficient documentation

## 2017-06-01 DIAGNOSIS — M7551 Bursitis of right shoulder: Secondary | ICD-10-CM | POA: Insufficient documentation

## 2017-06-01 DIAGNOSIS — I10 Essential (primary) hypertension: Secondary | ICD-10-CM | POA: Insufficient documentation

## 2017-06-01 NOTE — ED Triage Notes (Signed)
Patient ambulatory to triage with steady gait, without difficulty or distress noted; pt reports having right shoulder pain x 2 wks with movement of neck or shoulder; denies any known injury or hx of same

## 2017-06-02 ENCOUNTER — Emergency Department
Admission: EM | Admit: 2017-06-02 | Discharge: 2017-06-02 | Disposition: A | Discharge status: Home / Self Care | Payer: Self-pay | Attending: Emergency Medicine | Admitting: Emergency Medicine

## 2017-06-02 DIAGNOSIS — M7551 Bursitis of right shoulder: Secondary | ICD-10-CM

## 2017-06-02 MED ORDER — IBUPROFEN 600 MG PO TABS: 600.0000 mg | ORAL_TABLET | Freq: Three times a day (TID) | ORAL | 0 refills | Status: DC | PRN

## 2017-06-02 MED ORDER — HYDROCODONE-ACETAMINOPHEN 5-325 MG PO TABS: 1.0000 | ORAL_TABLET | Freq: Four times a day (QID) | ORAL | 0 refills | Status: DC | PRN

## 2017-06-02 MED ORDER — HYDROCODONE-ACETAMINOPHEN 5-325 MG PO TABS
1.0000 | ORAL_TABLET | Freq: Once | ORAL | Status: AC
  Administered 2017-06-02: 1 via ORAL
  Filled 2017-06-02: qty 1

## 2017-06-02 NOTE — Discharge Instructions (Signed)
Please take ibuprofen 3 times a day around-the-clock and follow-up with orthopedic surgery as needed and return to the emergency department sooner for any concerns.  It was a pleasure to take care of you today, and thank you for coming to our emergency department.  If you have any questions or concerns before leaving please ask the nurse to grab me and I'm more than happy to go through your aftercare instructions again.  If you were prescribed any opioid pain medication today such as Norco, Vicodin, Percocet, morphine, hydrocodone, or oxycodone please make sure you do not drive when you are taking this medication as it can alter your ability to drive safely.  If you have any concerns once you are home that you are not improving or are in fact getting worse before you can make it to your follow-up appointment, please do not hesitate to call 911 and come back for further evaluation.  Darel Hong, MD  Results for orders placed or performed during the hospital encounter of 82/95/62  Basic metabolic panel  Result Value Ref Range   Sodium 135 135 - 145 mmol/L   Potassium 3.6 3.5 - 5.1 mmol/L   Chloride 103 101 - 111 mmol/L   CO2 18 (L) 22 - 32 mmol/L   Glucose, Bld 207 (H) 65 - 99 mg/dL   BUN 9 6 - 20 mg/dL   Creatinine, Ser 0.63 0.44 - 1.00 mg/dL   Calcium 8.4 (L) 8.9 - 10.3 mg/dL   GFR calc non Af Amer >60 >60 mL/min   GFR calc Af Amer >60 >60 mL/min   Anion gap 14 5 - 15  CK  Result Value Ref Range   Total CK 101 38 - 234 U/L   Dg Shoulder Right  Result Date: 06/02/2017 CLINICAL DATA:  Right shoulder pain x2 weeks with movement of the shoulder and neck. EXAM: RIGHT SHOULDER - 2+ VIEW COMPARISON:  CXR 10/22/2014 FINDINGS: There is no evidence of fracture or dislocation. Minimal degenerative spurring off the inferior aspect of the glenoid. No soft tissue mass or mineralization. Soft tissues are unremarkable. 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 No acute osseous abnormality. Electronically Signed   By: Ashley Royalty M.D.   On: 06/02/2017 00:03

## 2017-06-02 NOTE — ED Provider Notes (Signed)
[facility] Emergency Department Provider Note  ____________________________________________   First MD Initiated Contact with Patient 06/02/17 0157     (approximate)  I have reviewed the triage vital signs and the nursing notes.   HISTORY  Chief Complaint Shoulder Pain   HPI Destiny Anderson is a 47 y.o. female right-hand-dominant woman who self presents to the emergency department with roughly 2 weeks of right lateral shoulder pain.  Atraumatic.  The pain is moderate to severe worse when raising her arm improved somewhat with rest.  The pain is now radiating up her right neck associated with spasm which prompted the visit today.  She is taken 1000 mg of ibuprofen intermittently with minimal relief.  She denies chest pain shortness of breath abdominal pain nausea or vomiting.  Past Medical History:  Diagnosis Date  . Depression   . Diabetes mellitus without complication ([place])   . Hypercholesteremia   . Hypertension     There are no active problems to display for this patient.   Past Surgical History:  Procedure Laterality Date  . CESAREAN SECTION     x4  . CHOLECYSTECTOMY    . HERNIA REPAIR      Prior to Admission medications   Medication Sig Start Date End Date Taking? Authorizing Provider  Ascorbic Acid (VITAMIN C) 1000 MG tablet Take 1,000 mg by mouth daily.    [provider]  B Complex-C (B-COMPLEX WITH VITAMIN C) tablet Take 1 tablet by mouth daily.    [provider]  esomeprazole (NEXIUM) 40 MG capsule Take 40 mg by mouth daily at 12 noon.    [provider]  ferrous sulfate 325 (65 FE) MG EC tablet Take 325 mg by mouth daily with breakfast.    [provider]  gabapentin (NEURONTIN) 300 MG capsule Take 300 mg by mouth 2 (two) times daily.    [provider]  HYDROcodone-acetaminophen (NORCO) 5-325 MG tablet Take 1 tablet by mouth every 6 (six) hours as needed for up to 7 doses for severe  pain. 06/02/17   Darel Hong, MD  ibuprofen (ADVIL,MOTRIN) 600 MG tablet Take 1 tablet (600 mg total) by mouth every 8 (eight) hours as needed. 06/02/17   Darel Hong, MD  insulin aspart (NOVOLOG) 100 UNIT/ML injection Inject 15 Units into the skin 2 (two) times daily.    [provider]  insulin detemir (LEVEMIR) 100 UNIT/ML injection Inject 53 Units into the skin 2 (two) times daily.    [provider]  linagliptin (TRADJENTA) 5 MG TABS tablet Take 5 mg by mouth daily.    [provider]  lisinopril-hydrochlorothiazide (PRINZIDE,ZESTORETIC) 20-12.5 MG tablet Take 1 tablet by mouth daily.    [provider]  metFORMIN (GLUCOPHAGE) 1000 MG tablet Take 1,000 mg by mouth 2 (two) times daily with a meal.    [provider]  metoprolol succinate (TOPROL-XL) 25 MG 24 hr tablet Take 25 mg by mouth daily.    [provider]  omeprazole (PRILOSEC) 40 MG capsule Take 40 mg by mouth daily.    [provider]  oxyCODONE-acetaminophen (ROXICET) 5-325 MG tablet Take 1 tablet by mouth every 4 (four) hours as needed for severe pain. 10/09/16   Paulette Blanch, MD  pravastatin (PRAVACHOL) 40 MG tablet Take 40 mg by mouth daily.    [provider]  venlafaxine XR (EFFEXOR-XR) 150 MG 24 hr capsule Take 150 mg by mouth daily with breakfast.    [provider]  Allergies Patient has no known allergies.  No family history on file.  Social History Social History   Tobacco Use  . Smoking status: Former Research scientist (life sciences)  . Smokeless tobacco: Never Used  . Tobacco comment: uses e-cig  Substance Use Topics  . Alcohol use: No  . Drug use: No    Review of Systems Constitutional: No fever/chills ENT: No sore throat. Cardiovascular: Denies chest pain. Respiratory: Denies shortness of breath. Gastrointestinal: No abdominal pain.  No nausea, no vomiting.  No diarrhea.  No constipation. Musculoskeletal: Positive for shoulder  pain Neurological: Negative for headaches   ____________________________________________   PHYSICAL EXAM:  VITAL SIGNS: ED Triage Vitals  Enc Vitals Group     BP 06/01/17 2323 (!) 152/100     Pulse Rate 06/01/17 2323 (!) 129     Resp 06/01/17 2323 20     Temp 06/01/17 2323 97.9 F (36.6 C)     Temp Source 06/01/17 2323 Oral     SpO2 06/01/17 2323 98 %     Weight 06/01/17 2324 (!) 330 lb (149.7 kg)     Height 06/01/17 2324 5\' 1"  (1.549 m)     Head Circumference --      Peak Flow --      Pain Score 06/01/17 2323 9     Pain Loc --      Pain Edu? --      Excl. in [place]? --     Constitutional: Alert and oriented x4 morbidly obese appears somewhat uncomfortable nontoxic no diaphoresis speaks full clear sentences Head: Atraumatic. Nose: No congestion/rhinnorhea. Mouth/Throat: No trismus Neck: No stridor.  No meningismus some muscle spasm and tenderness right lateral neck Cardiovascular: Tachycardic regular rhythm Respiratory: Normal respiratory effort.  No retractions. MSK: Exquisitely tender over right lateral shoulder with discomfort when attempting to abduct beyond 90 degrees able to internally externally rotate.  No bony tenderness.  Neurovascularly intact Neurologic:  Normal speech and language. No gross focal neurologic deficits are appreciated.  Skin:  Skin is warm, dry and intact. No rash noted.    ____________________________________________  LABS (all labs ordered are listed, but only abnormal results are displayed)  Labs Reviewed - No data to display   __________________________________________  EKG   ____________________________________________  RADIOLOGY  X-ray of the right shoulder shows degenerative changes ____________________________________________   DIFFERENTIAL includes but not limited to  Shoulder dislocation, shoulder sprain, shoulder fracture, percent   PROCEDURES  Procedure(s) performed: no  Procedures  Critical Care performed:  no  Observation: no ____________________________________________   INITIAL IMPRESSION / ASSESSMENT AND PLAN / ED COURSE  Pertinent labs & imaging results that were available during my care of the patient were reviewed by me and considered in my medical decision making (see chart for details).  The patient arrives neurovascularly intact with severe discomfort and tenderness over the lateral aspect of shoulder and unable to abduct beyond 90 degrees.  Her clinical symptoms are most consistent with a chronic bursitis.  She has requested a cortisone injection which I am unable to provide now.  Pain is improved after hydrocodone.  I will give her a short course to help through the acute setting but most importantly she needs nonsteroidals and shoulder exercises.  Referral to Ortho as an outpatient.  She verbalizes understanding agree with plan.      ____________________________________________   FINAL CLINICAL IMPRESSION(S) / ED DIAGNOSES  Final diagnoses:  Bursitis of right shoulder      NEW MEDICATIONS STARTED DURING THIS VISIT:  Discharge  Medication List as of 06/02/2017  2:07 AM    START taking these medications   Details  HYDROcodone-acetaminophen (NORCO) 5-325 MG tablet Take 1 tablet by mouth every 6 (six) hours as needed for up to 7 doses for severe pain., Starting Thu 06/02/2017, Print         Note:  This document was prepared using Dragon voice recognition software and may include unintentional dictation errors.      Darel Hong, MD 06/02/17 2210

## 2018-10-08 IMAGING — US US EXTREM LOW VENOUS*R*
1 series · 13 of 24 positions shown · non-contrast
Comparison: None.

CLINICAL DATA: Initial evaluation for acute pain behind right knee
and upper thigh.



[Series 1: us extrem low venous*right* · 0.09mm/px · 13 of 33 slices shown]
[im 1/33]
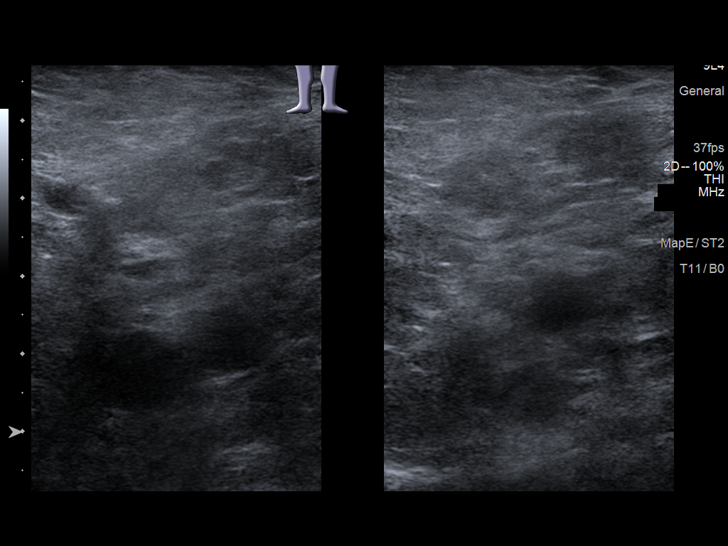
[im 3/33]
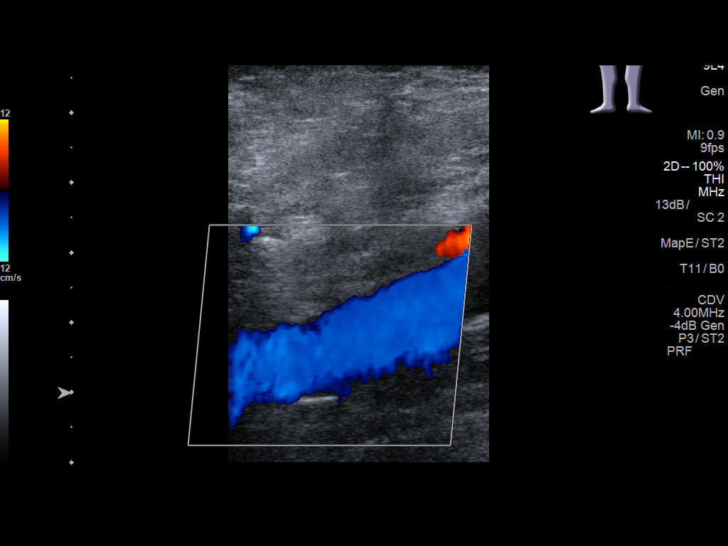
[im 6/33]
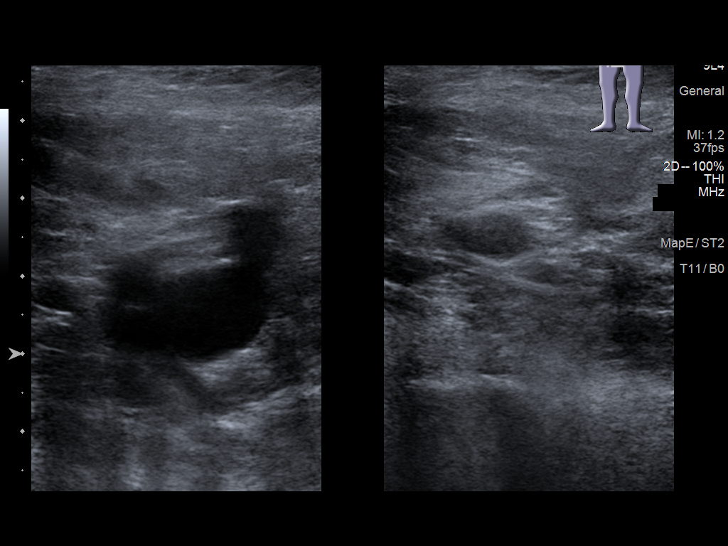
[im 9/33]
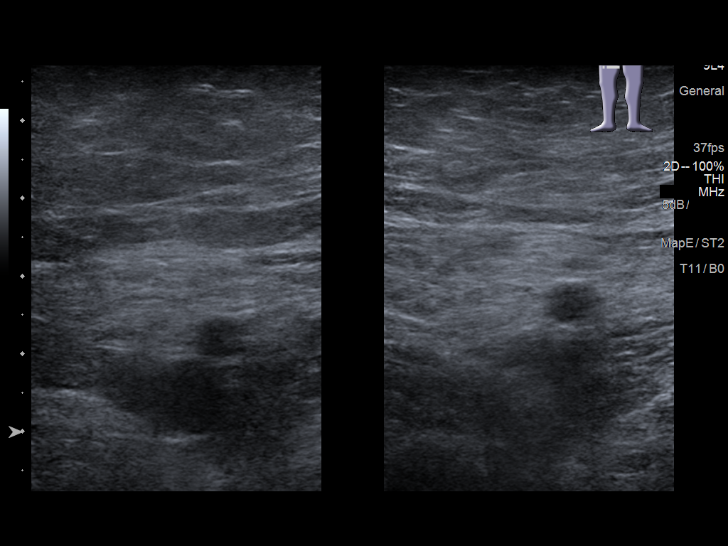
[im 12/33]
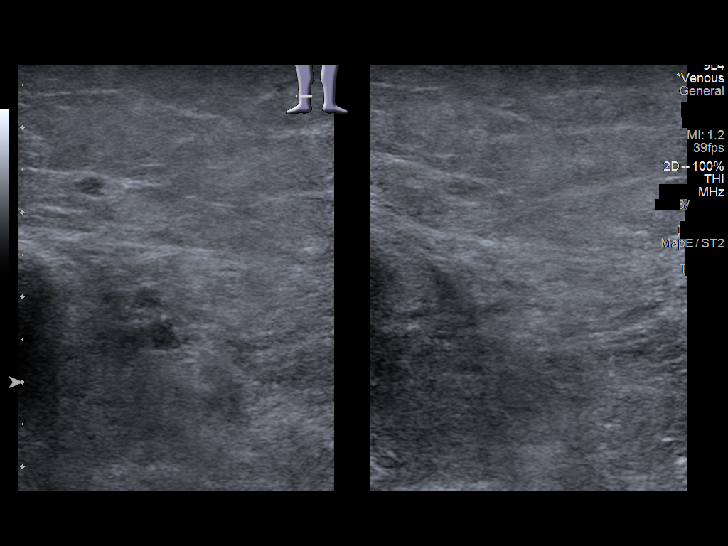
[im 14/33]
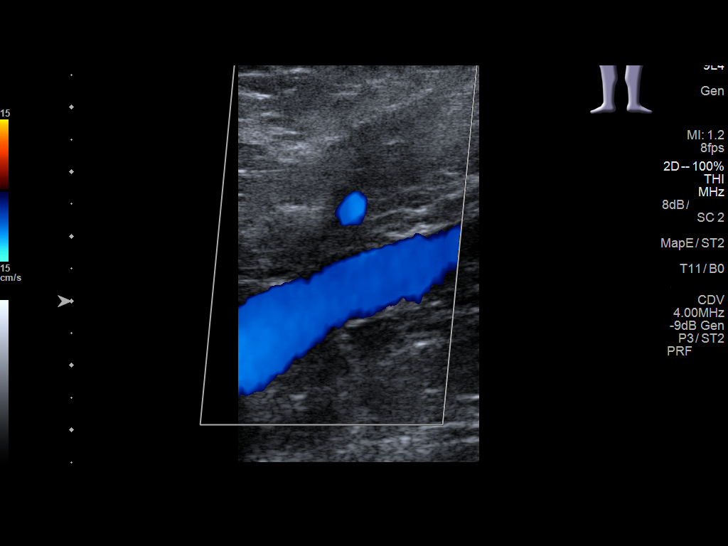
[im 17/33]
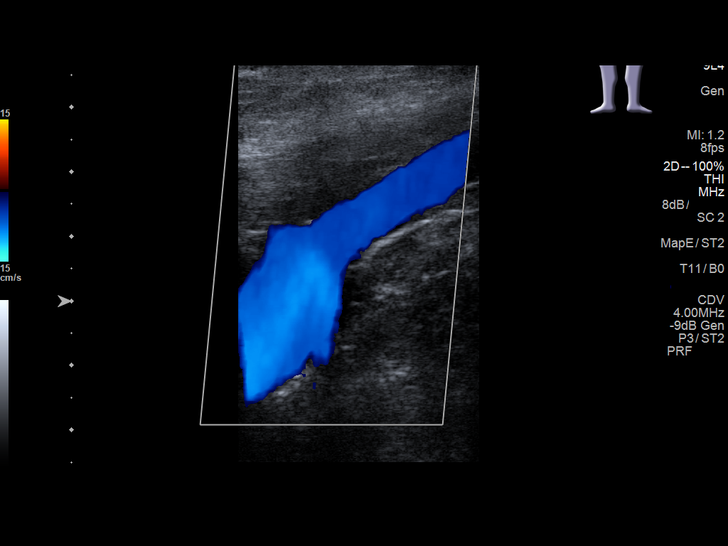
[im 19/33]
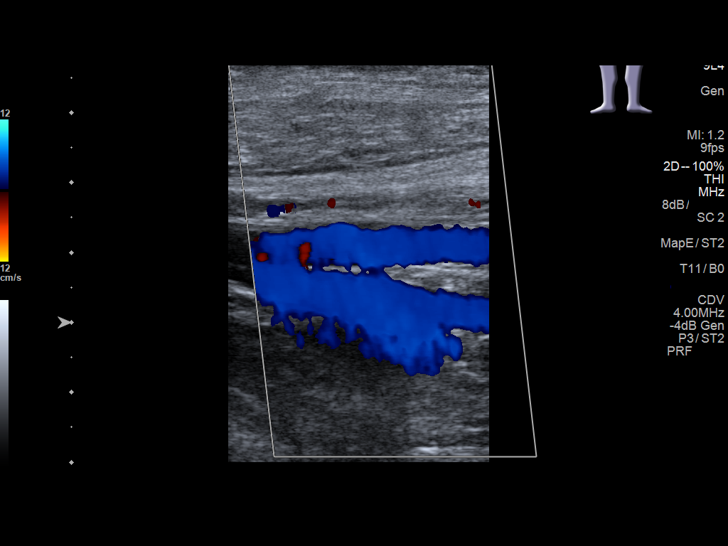
[im 21/33]
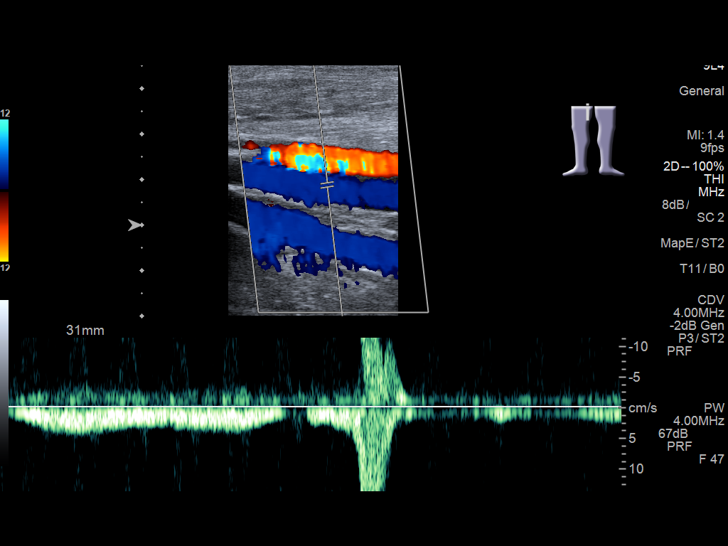
[im 24/33]
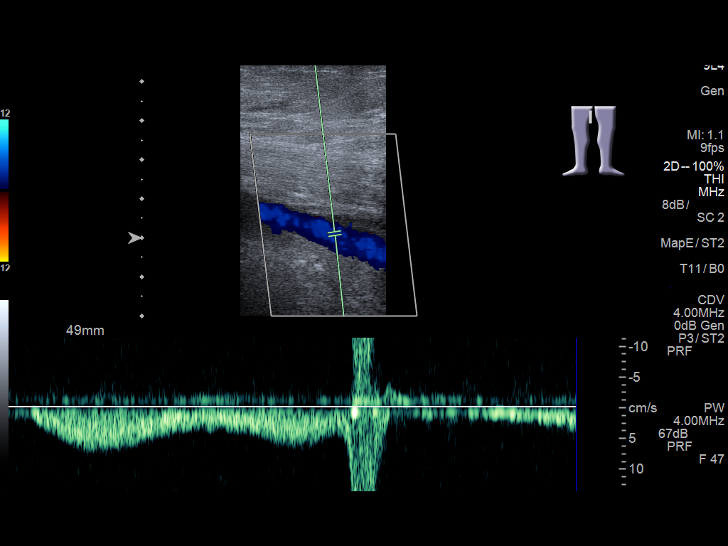
[im 27/33]
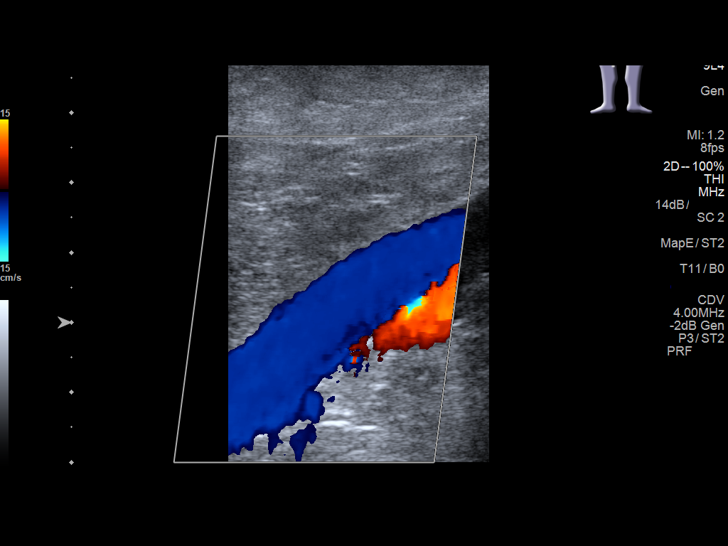
[im 30/33]
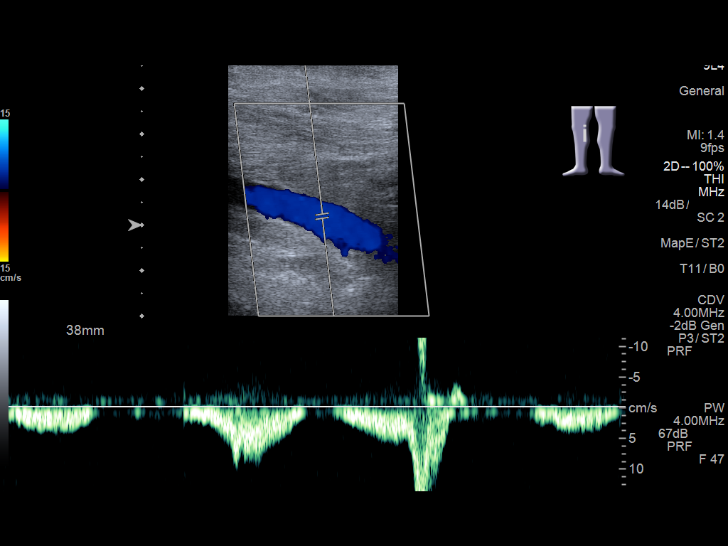
[im 33/33]
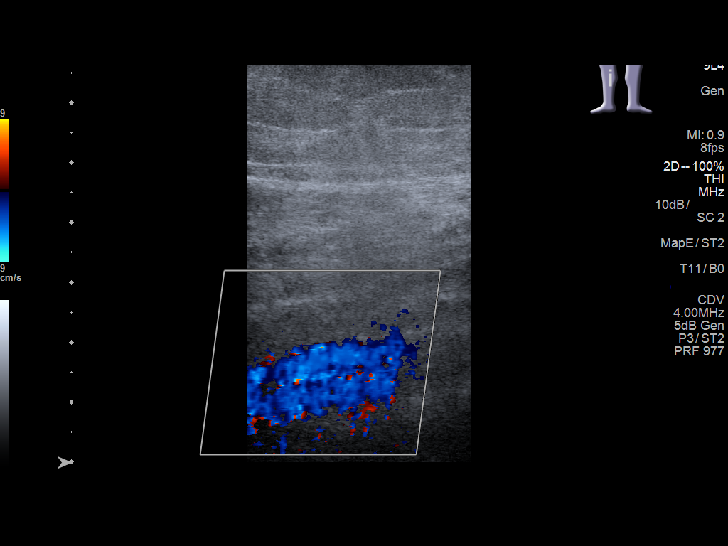

[13 of 24 positions shown; findings below may reference images not displayed]

FINDINGS: Contralateral Common Femoral Vein: Respiratory phasicity is normal
and symmetric with the symptomatic side. No evidence of thrombus.
Normal compressibility.

Common Femoral Vein: No evidence of thrombus. Normal
compressibility, respiratory phasicity and response to augmentation.

Saphenofemoral Junction: No evidence of thrombus. Normal
compressibility and flow on color Doppler imaging.

Profunda Femoral Vein: No evidence of thrombus. Normal
compressibility and flow on color Doppler imaging.

Femoral Vein: No evidence of thrombus. Normal compressibility,
respiratory phasicity and response to augmentation.

Popliteal Vein: No evidence of thrombus. Normal compressibility,
respiratory phasicity and response to augmentation.

Calf Veins: No evidence of thrombus. Normal compressibility and flow
on color Doppler imaging.

Superficial Great Saphenous Vein: No evidence of thrombus. Normal
compressibility and flow on color Doppler imaging.

Venous Reflux:  None.

Other Findings:  None.
IMPRESSION: No evidence of DVT within the right lower extremity.

## 2018-10-15 IMAGING — CR DG SHOULDER 2+V*R*
1 series · 3 of 3 positions shown · non-contrast
Comparison: CXR 10/22/2014

CLINICAL DATA: Right shoulder pain x2 weeks with movement of the
shoulder and neck.

EXAM:
RIGHT SHOULDER - 2+ VIEW

[Series 1: dg shoulder right · 0.14mm/px · 3 of 3 slices shown]
[im 1/3]
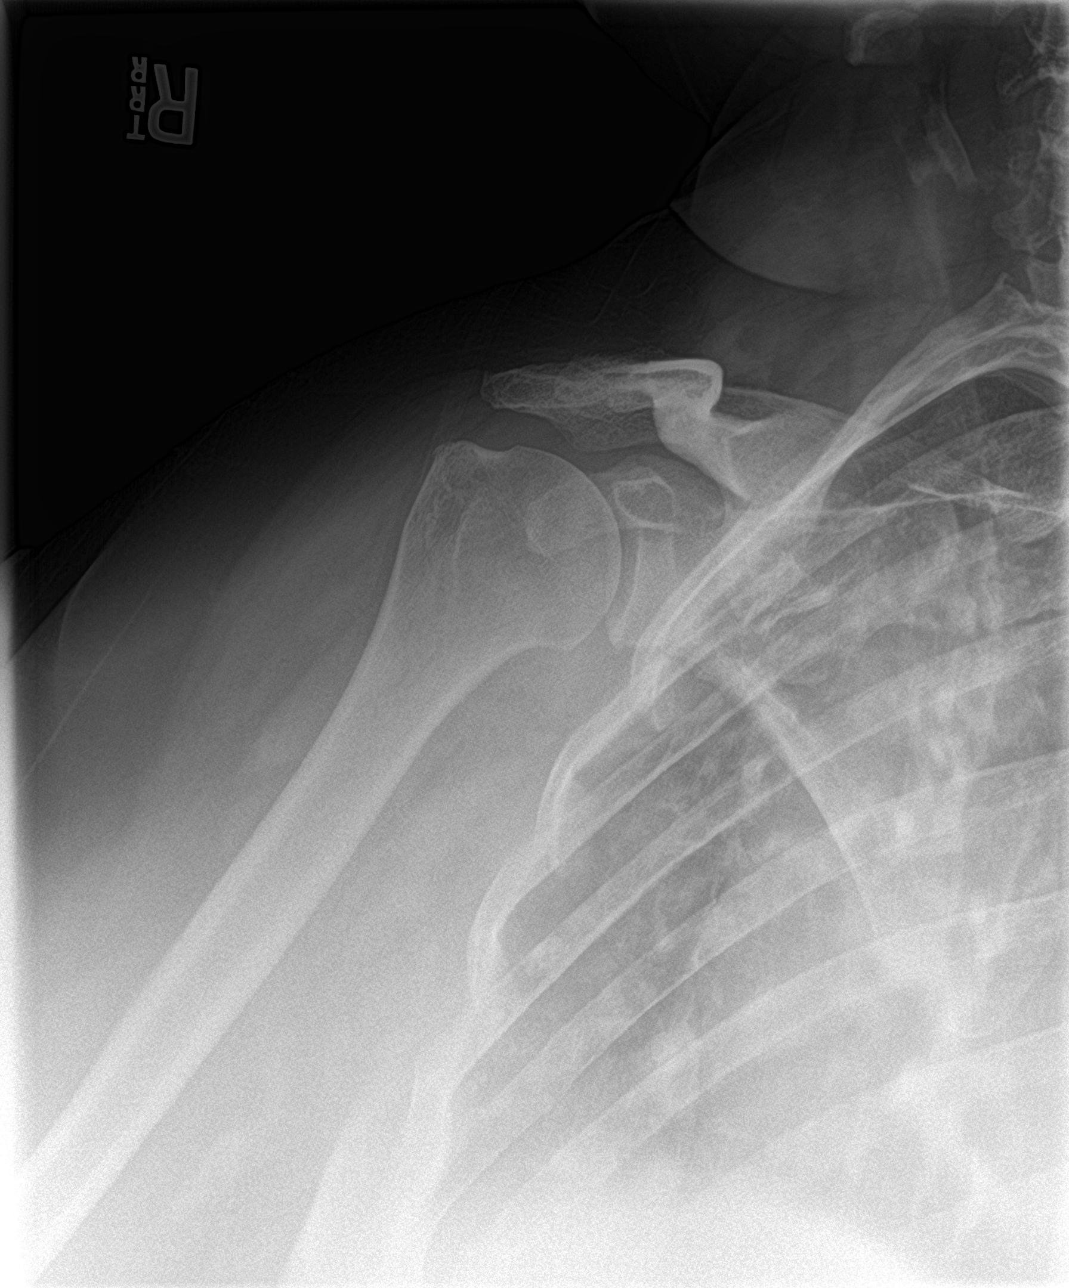
[im 2/3]
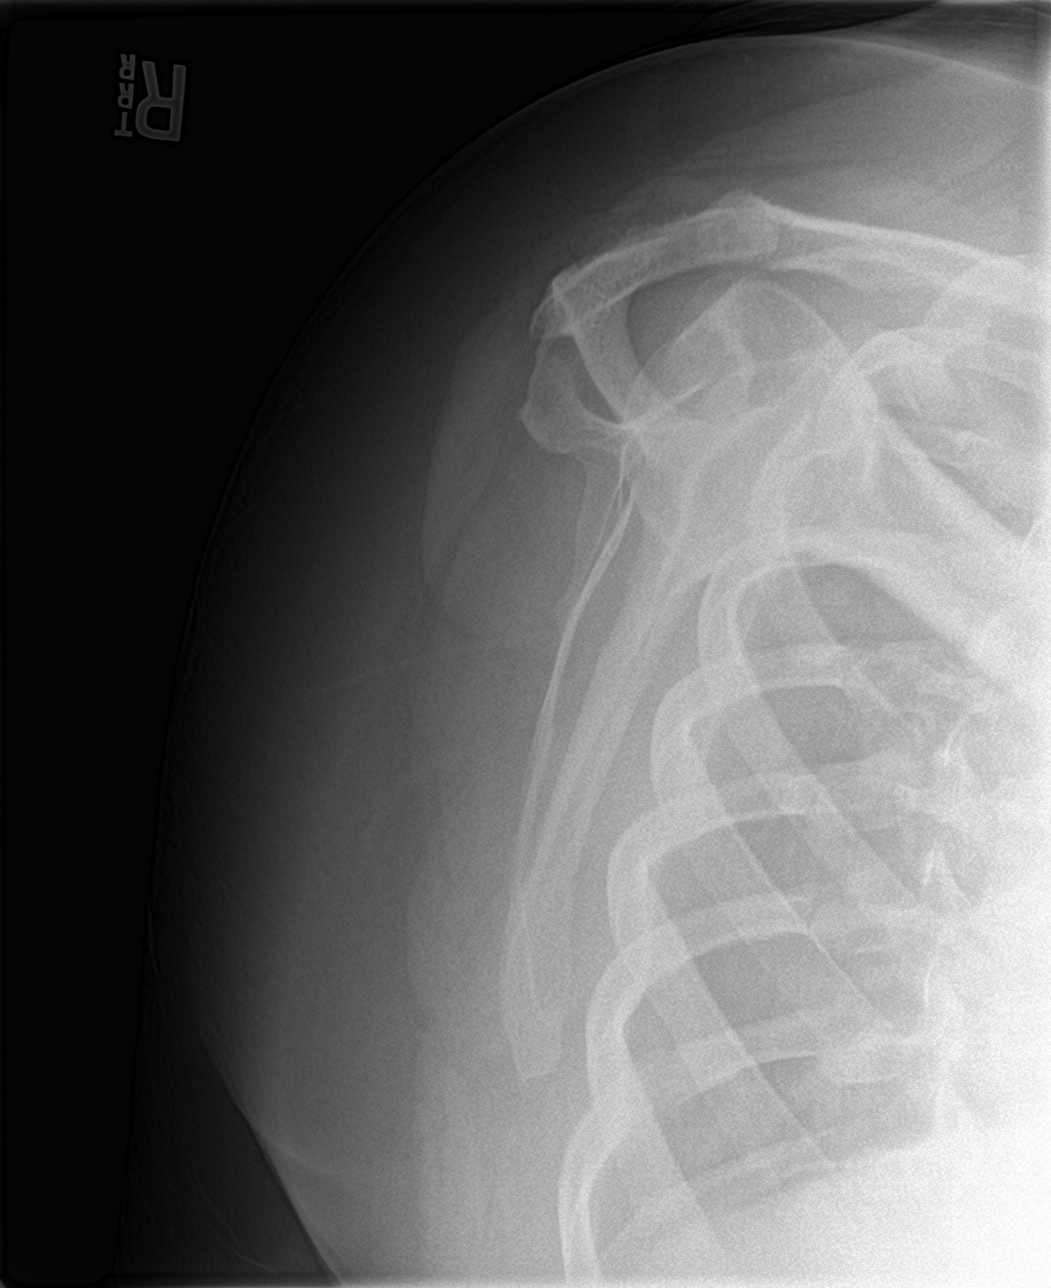
[im 3/3]
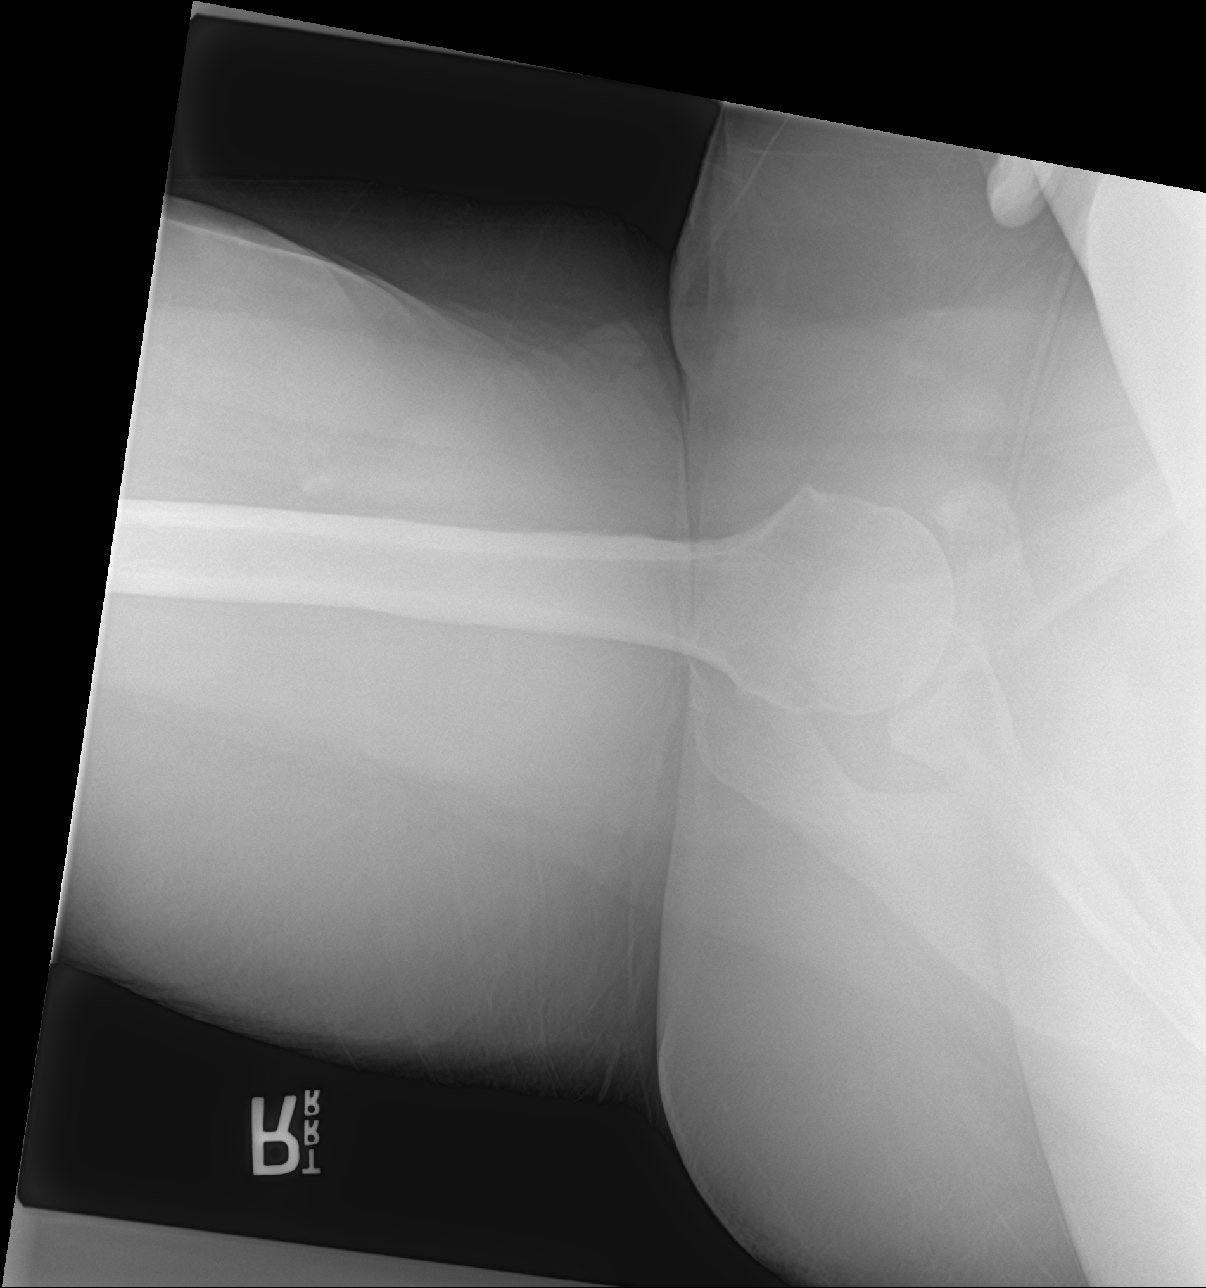

[3 of 3 positions shown; findings below may reference images not displayed]

FINDINGS: There is no evidence of fracture or dislocation. Minimal
degenerative spurring off the inferior aspect of the glenoid. No
soft tissue mass or mineralization. Soft tissues are unremarkable.
IMPRESSION: Mild degenerative change of the glenohumeral joint with spurring off
the inferior glenoid. No acute osseous abnormality.

## 2020-07-08 ENCOUNTER — Other Ambulatory Visit: Payer: Self-pay

## 2020-07-08 ENCOUNTER — Encounter: Payer: Self-pay | Admitting: Emergency Medicine

## 2020-07-08 DIAGNOSIS — Z794 Long term (current) use of insulin: Secondary | ICD-10-CM | POA: Diagnosis not present

## 2020-07-08 DIAGNOSIS — E119 Type 2 diabetes mellitus without complications: Secondary | ICD-10-CM | POA: Diagnosis not present

## 2020-07-08 DIAGNOSIS — Z7984 Long term (current) use of oral hypoglycemic drugs: Secondary | ICD-10-CM | POA: Diagnosis not present

## 2020-07-08 DIAGNOSIS — Z79899 Other long term (current) drug therapy: Secondary | ICD-10-CM | POA: Diagnosis not present

## 2020-07-08 DIAGNOSIS — I10 Essential (primary) hypertension: Secondary | ICD-10-CM | POA: Diagnosis not present

## 2020-07-08 DIAGNOSIS — R059 Cough, unspecified: Secondary | ICD-10-CM | POA: Insufficient documentation

## 2020-07-08 DIAGNOSIS — Z20822 Contact with and (suspected) exposure to covid-19: Secondary | ICD-10-CM | POA: Insufficient documentation

## 2020-07-08 DIAGNOSIS — M545 Low back pain, unspecified: Secondary | ICD-10-CM | POA: Diagnosis not present

## 2020-07-08 DIAGNOSIS — R0602 Shortness of breath: Secondary | ICD-10-CM | POA: Diagnosis present

## 2020-07-08 DIAGNOSIS — Z87891 Personal history of nicotine dependence: Secondary | ICD-10-CM | POA: Diagnosis not present

## 2020-07-08 DIAGNOSIS — D649 Anemia, unspecified: Secondary | ICD-10-CM | POA: Diagnosis not present

## 2020-07-08 DIAGNOSIS — R0781 Pleurodynia: Secondary | ICD-10-CM | POA: Insufficient documentation

## 2020-07-08 DIAGNOSIS — R600 Localized edema: Secondary | ICD-10-CM | POA: Diagnosis not present

## 2020-07-08 NOTE — ED Triage Notes (Addendum)
Pt arrived via POV with reports of increased swelling since Thursday as well as shortness of breath, pt states it is difficult to get to the bathroom, pt short of breath speaking in complete sentences.   Pt states she recently ran out of her lisinopril about 10 days ago. Pt states she had telehealth visit with her PCP.   Pt reports she is retaining fluid in her abdomen as well. PT states she has had decreased urine output.   Pt has 1+ pitting edema as well as bilateral low back pain with the R worse than the L.

## 2020-07-09 ENCOUNTER — Emergency Department: Payer: Medicaid Other

## 2020-07-09 ENCOUNTER — Emergency Department
Admission: EM | Admit: 2020-07-09 | Discharge: 2020-07-09 | Disposition: A | Discharge status: Home / Self Care | Payer: Medicaid Other | Attending: Emergency Medicine | Admitting: Emergency Medicine

## 2020-07-09 DIAGNOSIS — D649 Anemia, unspecified: Secondary | ICD-10-CM

## 2020-07-09 DIAGNOSIS — R0602 Shortness of breath: Secondary | ICD-10-CM

## 2020-07-09 LAB — CBC WITH DIFFERENTIAL/PLATELET
Abs Immature Granulocytes: 0.08 10*3/uL — ABNORMAL HIGH (ref 0.00–0.07)
Basophils Absolute: 0 10*3/uL (ref 0.0–0.1)
Basophils Relative: 0 %
Eosinophils Absolute: 0.2 10*3/uL (ref 0.0–0.5)
Eosinophils Relative: 2 %
HCT: 29.8 % — ABNORMAL LOW (ref 36.0–46.0)
Hemoglobin: 8.8 g/dL — ABNORMAL LOW (ref 12.0–15.0)
Immature Granulocytes: 1 %
Lymphocytes Relative: 16 %
Lymphs Abs: 1.5 10*3/uL (ref 0.7–4.0)
MCH: 20.8 pg — ABNORMAL LOW (ref 26.0–34.0)
MCHC: 29.5 g/dL — ABNORMAL LOW (ref 30.0–36.0)
MCV: 70.3 fL — ABNORMAL LOW (ref 80.0–100.0)
Monocytes Absolute: 0.5 10*3/uL (ref 0.1–1.0)
Monocytes Relative: 5 %
Neutro Abs: 7.5 10*3/uL (ref 1.7–7.7)
Neutrophils Relative %: 76 %
Platelets: 455 10*3/uL — ABNORMAL HIGH (ref 150–400)
RBC: 4.24 MIL/uL (ref 3.87–5.11)
RDW: 21.2 % — ABNORMAL HIGH (ref 11.5–15.5)
WBC: 9.8 10*3/uL (ref 4.0–10.5)
nRBC: 0.6 % — ABNORMAL HIGH (ref 0.0–0.2)

## 2020-07-09 LAB — COMPREHENSIVE METABOLIC PANEL
ALT: 19 U/L (ref 0–44)
AST: 23 U/L (ref 15–41)
Albumin: 3.3 g/dL — ABNORMAL LOW (ref 3.5–5.0)
Alkaline Phosphatase: 80 U/L (ref 38–126)
Anion gap: 9 (ref 5–15)
BUN: 9 mg/dL (ref 6–20)
CO2: 21 mmol/L — ABNORMAL LOW (ref 22–32)
Calcium: 8.5 mg/dL — ABNORMAL LOW (ref 8.9–10.3)
Chloride: 104 mmol/L (ref 98–111)
Creatinine, Ser: 0.38 mg/dL — ABNORMAL LOW (ref 0.44–1.00)
GFR, Estimated: >60 mL/min (ref >60)
Glucose, Bld: 177 mg/dL — ABNORMAL HIGH (ref 70–99)
Potassium: 4.2 mmol/L (ref 3.5–5.1)
Sodium: 134 mmol/L — ABNORMAL LOW (ref 135–145)
Total Bilirubin: 0.4 mg/dL (ref 0.3–1.2)
Total Protein: 7.2 g/dL (ref 6.5–8.1)

## 2020-07-09 LAB — URINALYSIS, COMPLETE (UACMP) WITH MICROSCOPIC
Bilirubin Urine: NEGATIVE
Glucose, UA: 50 mg/dL — AB
Ketones, ur: NEGATIVE mg/dL
Leukocytes,Ua: NEGATIVE
Nitrite: NEGATIVE
Protein, ur: NEGATIVE mg/dL
Specific Gravity, Urine: 1.012 (ref 1.005–1.030)
pH: 5 (ref 5.0–8.0)

## 2020-07-09 LAB — TROPONIN I (HIGH SENSITIVITY)
Troponin I (High Sensitivity): 11 ng/L (ref <18)
Troponin I (High Sensitivity): 11 ng/L (ref <18)

## 2020-07-09 LAB — RESP PANEL BY RT-PCR (FLU A&B, COVID) ARPGX2
Influenza A by PCR: NEGATIVE
Influenza B by PCR: NEGATIVE
SARS Coronavirus 2 by RT PCR: NEGATIVE

## 2020-07-09 LAB — POC URINE PREG, ED: Preg Test, Ur: NEGATIVE

## 2020-07-09 LAB — BRAIN NATRIURETIC PEPTIDE: B Natriuretic Peptide: 57.1 pg/mL (ref 0.0–100.0)

## 2020-07-09 MED ORDER — FERROUS SULFATE 325 (65 FE) MG PO TBEC: 325.0000 mg | DELAYED_RELEASE_TABLET | Freq: Every day | ORAL | 2 refills | Status: AC

## 2020-07-09 MED ORDER — FUROSEMIDE 10 MG/ML IJ SOLN
60.0000 mg | Freq: Once | INTRAMUSCULAR | Status: AC
  Administered 2020-07-09: 60 mg via INTRAVENOUS
  Filled 2020-07-09: qty 8

## 2020-07-09 MED ORDER — FUROSEMIDE 40 MG PO TABS: 40.0000 mg | ORAL_TABLET | Freq: Every day | ORAL | 0 refills | Status: DC

## 2020-07-09 NOTE — ED Provider Notes (Signed)
[facility] Emergency Department Provider Note   ____________________________________________   I have reviewed the triage vital signs and the nursing notes.   HISTORY  Chief Complaint Shortness of breath  History limited by: Not Limited   HPI Destiny Anderson is a 50 y.o. female who presents to the emergency department today with primary concern for shortness of breath. The patient states that the symptoms started roughly 6 days ago. Her shortness of breath is markedly worse with exertion and worse with laying down. It has been accompanied by a non productive cough. Some rib pain which she thinks is related to the cough. The patient states that she has also noticed swelling to her stomach and legs. Additionally has been having some low back pain. She denies any fevers. Does state she has been out of her lisinopril recently. The patient denies similar shortness of breath in the past.    Records reviewed. Per medical record review patient has a history of DM, HTN, HLD.   Past Medical History:  Diagnosis Date  . Depression   . Diabetes mellitus without complication ([place])   . Hypercholesteremia   . Hypertension     There are no problems to display for this patient.   Past Surgical History:  Procedure Laterality Date  . CESAREAN SECTION     x4  . CHOLECYSTECTOMY    . HERNIA REPAIR      Prior to Admission medications   Medication Sig Start Date End Date Taking? Authorizing Provider  Ascorbic Acid (VITAMIN C) 1000 MG tablet Take 1,000 mg by mouth daily.    [provider]  B Complex-C (B-COMPLEX WITH VITAMIN C) tablet Take 1 tablet by mouth daily.    [provider]  esomeprazole (NEXIUM) 40 MG capsule Take 40 mg by mouth daily at 12 noon.    [provider]  ferrous sulfate 325 (65 FE) MG EC tablet Take 325 mg by mouth daily with breakfast.    [provider]  gabapentin (NEURONTIN) 300 MG capsule Take 300 mg by  mouth 2 (two) times daily.    [provider]  HYDROcodone-acetaminophen (NORCO) 5-325 MG tablet Take 1 tablet by mouth every 6 (six) hours as needed for up to 7 doses for severe pain. 06/02/17   Darel Hong, MD  ibuprofen (ADVIL,MOTRIN) 600 MG tablet Take 1 tablet (600 mg total) by mouth every 8 (eight) hours as needed. 06/02/17   Darel Hong, MD  insulin aspart (NOVOLOG) 100 UNIT/ML injection Inject 15 Units into the skin 2 (two) times daily.    [provider]  insulin detemir (LEVEMIR) 100 UNIT/ML injection Inject 53 Units into the skin 2 (two) times daily.    [provider]  linagliptin (TRADJENTA) 5 MG TABS tablet Take 5 mg by mouth daily.    [provider]  lisinopril-hydrochlorothiazide (PRINZIDE,ZESTORETIC) 20-12.5 MG tablet Take 1 tablet by mouth daily.    [provider]  metFORMIN (GLUCOPHAGE) 1000 MG tablet Take 1,000 mg by mouth 2 (two) times daily with a meal.    [provider]  metoprolol succinate (TOPROL-XL) 25 MG 24 hr tablet Take 25 mg by mouth daily.    [provider]  omeprazole (PRILOSEC) 40 MG capsule Take 40 mg by mouth daily.    [provider]  oxyCODONE-acetaminophen (ROXICET) 5-325 MG tablet Take 1 tablet by mouth every 4 (four) hours as needed for severe pain. 10/09/16   Paulette Blanch, MD  pravastatin (PRAVACHOL) 40 MG  tablet Take 40 mg by mouth daily.    [provider]  venlafaxine XR (EFFEXOR-XR) 150 MG 24 hr capsule Take 150 mg by mouth daily with breakfast.    [provider]    Allergies Patient has no known allergies.  History reviewed. No pertinent family history.  Social History Social History   Tobacco Use  . Smoking status: Former Research scientist (life sciences)  . Smokeless tobacco: Never Used  . Tobacco comment: uses e-cig  Substance Use Topics  . Alcohol use: No  . Drug use: No    Review of Systems Constitutional: No fever/chills Eyes: No visual changes. ENT: No sore  throat. Cardiovascular: Positive for chest pain. Respiratory: Positive for shortness of breath and cough. Gastrointestinal: Positive for increased abdominal swelling.   Genitourinary: Negative for dysuria. Musculoskeletal: Positive for leg swelling. Positive for low back pain. Skin: Negative for rash. Neurological: Negative for headaches, focal weakness or numbness.  ____________________________________________   PHYSICAL EXAM:  VITAL SIGNS: ED Triage Vitals  Enc Vitals Group     BP 07/08/20 2351 (!) 159/78     Pulse Rate 07/08/20 2351 (!) 110     Resp 07/08/20 2351 20     Temp 07/08/20 2351 98.3 F (36.8 C)     Temp Source 07/08/20 2351 Oral     SpO2 07/08/20 2351 96 %     Weight 07/08/20 2351 (!) 340 lb (154.2 kg)     Height 07/08/20 2351 5\' 3"  (1.6 m)     Head Circumference --      Peak Flow --      Pain Score 07/08/20 2355 0   Constitutional: Alert and oriented.  Eyes: Conjunctivae are normal.  ENT      Head: Normocephalic and atraumatic.      Nose: No congestion/rhinnorhea.      Mouth/Throat: Mucous membranes are moist.      Neck: No stridor. Hematological/Lymphatic/Immunilogical: No cervical lymphadenopathy. Cardiovascular: Normal rate, regular rhythm.  No murmurs, rubs, or gallops.  Respiratory: Normal respiratory effort without tachypnea nor retractions. Breath sounds are clear and equal bilaterally. No wheezes/rales/rhonchi. Gastrointestinal: Soft and non tender. No rebound. No guarding.  Genitourinary: Deferred Musculoskeletal: Normal range of motion in all extremities. Bilateral lower extremity edema.  Neurologic:  Normal speech and language. No gross focal neurologic deficits are appreciated.  Skin:  Skin is warm, dry and intact. No rash noted. Psychiatric: Mood and affect are normal. Speech and behavior are normal. Patient exhibits appropriate insight and judgment.  ____________________________________________    LABS (pertinent  positives/negatives)  CBC wbc 9.8, hgb 8.8, plt 455 Trop hs 11 CMP na 134, k 4.2, glu 177, cr 0.38 COVID negative Trop hs 11 BNP 57.1 ____________________________________________   EKG  I, Nance Pear, attending physician, personally viewed and interpreted this EKG  EKG Time: 0007 Rate: 106 Rhythm: sinus tachycardia Axis: normal Intervals: qtc 451 QRS: low voltage qrs ST changes: no st elevation Impression: abnormal ekg   ____________________________________________    RADIOLOGY  CXR Central vascular congestion without overt edema  ____________________________________________   PROCEDURES  Procedures  ____________________________________________   INITIAL IMPRESSION / ASSESSMENT AND PLAN / ED COURSE  Pertinent labs & imaging results that were available during my care of the patient were reviewed by me and considered in my medical decision making (see chart for details).   Patient presented to the emergency department today because of concerns for shortness of breath.  On exam patient's lungs were clear.  She did have some edema to the  lower extremities.  Patient's checks x-ray without any pneumonia or pneumothorax.  Did show some vascular congestion.  Did have concerns for possible fluid overload and patient was given Lasix.  This did result in a large amount of urine output.  She then did feel better with ambulation.  She did not have any desaturations with ambulation.  At this time I do think could be fluid overload related so will give patient prescription for Lasix for the next couple of days.  Did discuss with patient importance of close primary care follow-up.  Additionally the patient's blood work does show anemia here.  The last blood work we have in our system was 50 years old.  I discussed with the patient states states she does have a known history of anemia but is unsure what her recent lab values would be.  She states she has occasionally in the past  seen bloody stools but nothing recently.  At this time given reported history of anemia and the fact that she does feel better after urine output I have lower suspicion that the anemia is an acute issue causing the patient's symptoms.  I do however think she would benefit from going back on iron which she states she has been on occasionally in the past.  Additionally do think this would be another issue that she would benefit from close PCP follow-up. ____________________________________________   FINAL CLINICAL IMPRESSION(S) / ED DIAGNOSES  Final diagnoses:  SOB (shortness of breath)  Anemia, unspecified type     Note: This dictation was prepared with Dragon dictation. Any transcriptional errors that result from this process are unintentional     Nance Pear, MD 07/09/20 [phone]

## 2020-07-09 NOTE — Discharge Instructions (Addendum)
Please return if you notice any worsening shortness of breath, any bloody stool or black or tarry stool, chest pain, high fevers or any other new or concerning symptoms.

## 2020-07-09 NOTE — ED Notes (Signed)
Patient ambulated in room, SPO2 98-99% while ambulating. Dr Archie Balboa notified

## 2021-06-22 ENCOUNTER — Other Ambulatory Visit: Payer: Self-pay | Admitting: Primary Care

## 2021-06-22 DIAGNOSIS — Z1231 Encounter for screening mammogram for malignant neoplasm of breast: Secondary | ICD-10-CM

## 2021-06-22 DIAGNOSIS — D171 Benign lipomatous neoplasm of skin and subcutaneous tissue of trunk: Secondary | ICD-10-CM

## 2021-11-22 IMAGING — CR DG CHEST 2V
1 series · 2 of 2 positions shown · non-contrast
Comparison: 10/22/2014

CLINICAL DATA: Short of breath, increasing swelling

EXAM:
CHEST - 2 VIEW

[Series 1: dg chest 2 view · 0.14mm/px · 2 of 2 slices shown]
[im 1/2]
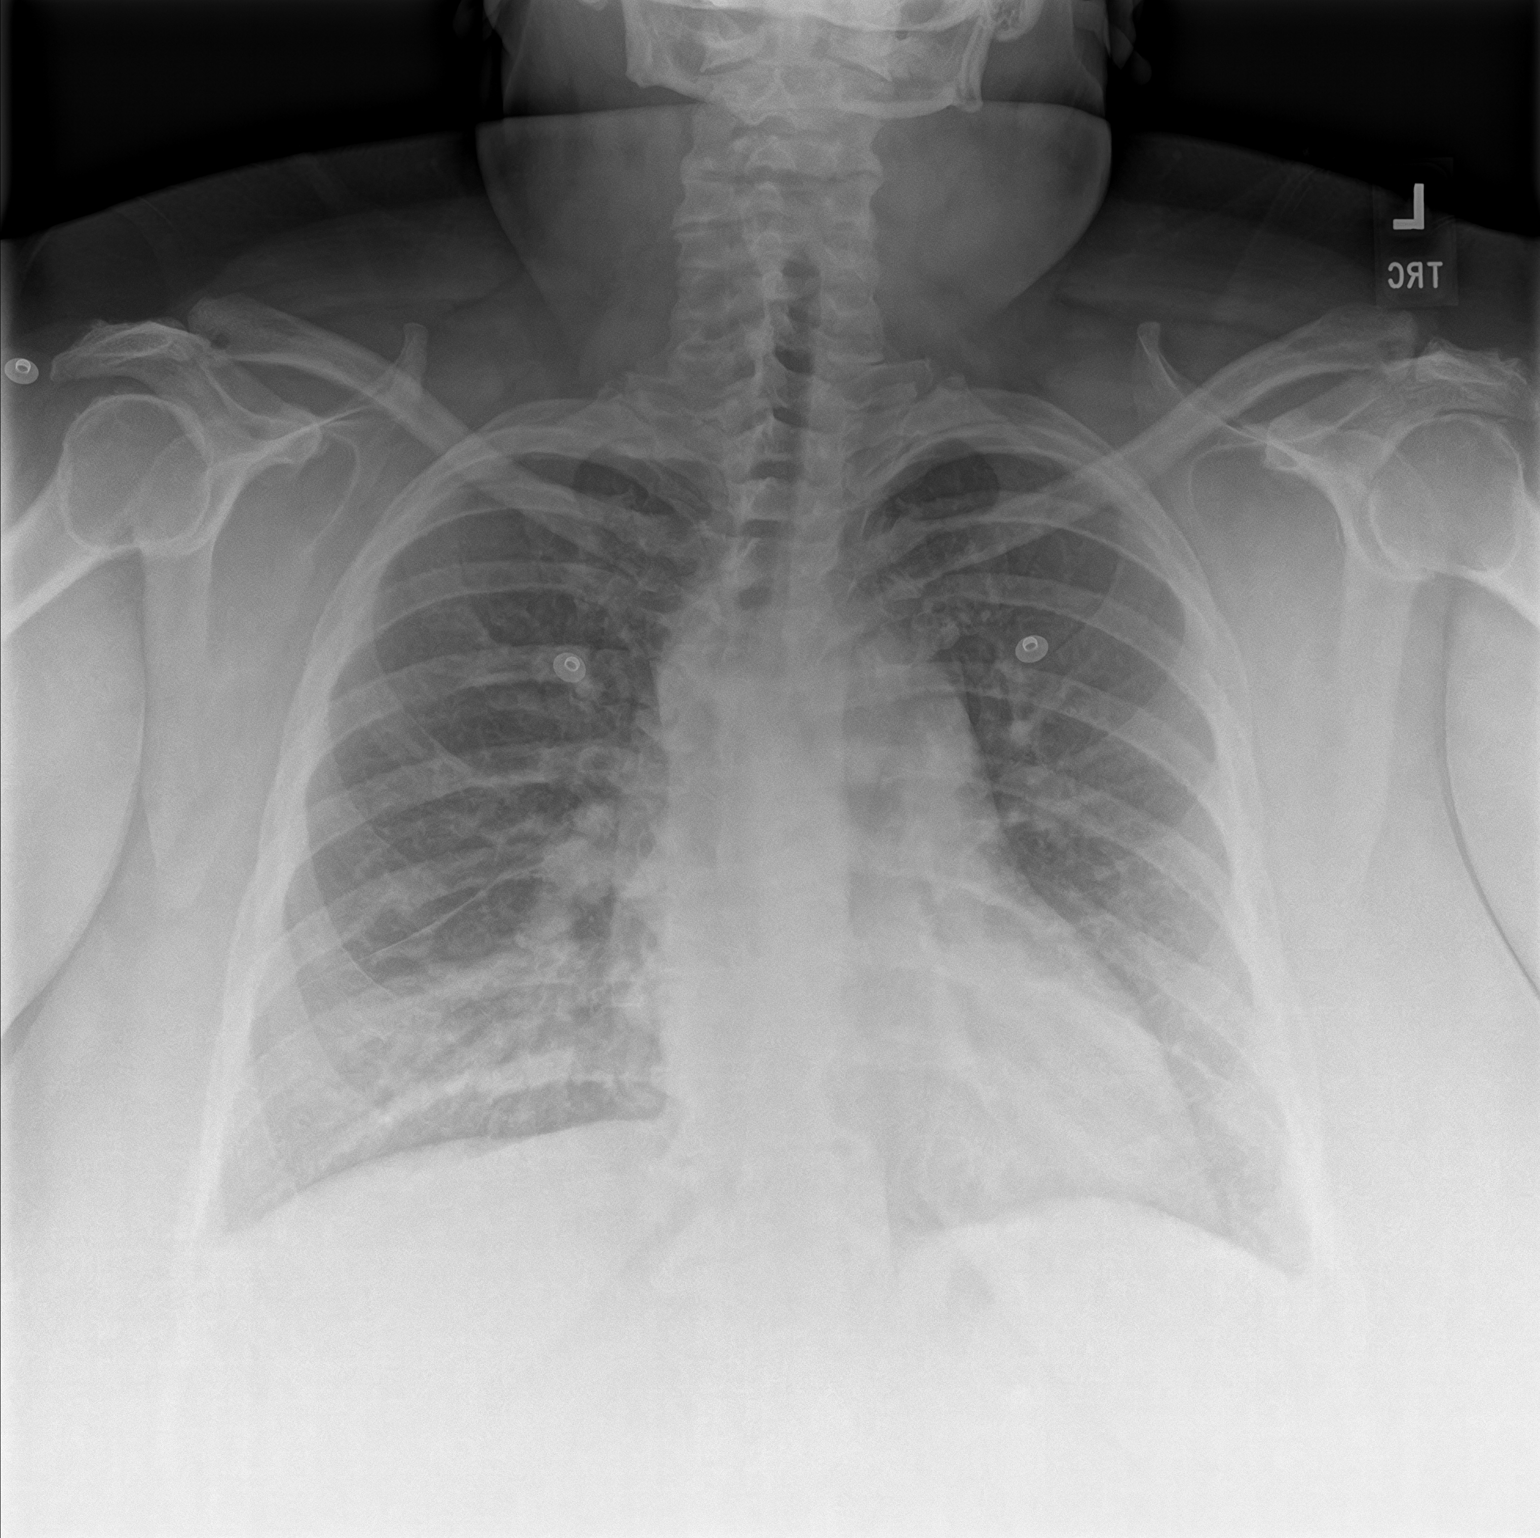
[im 2/2]
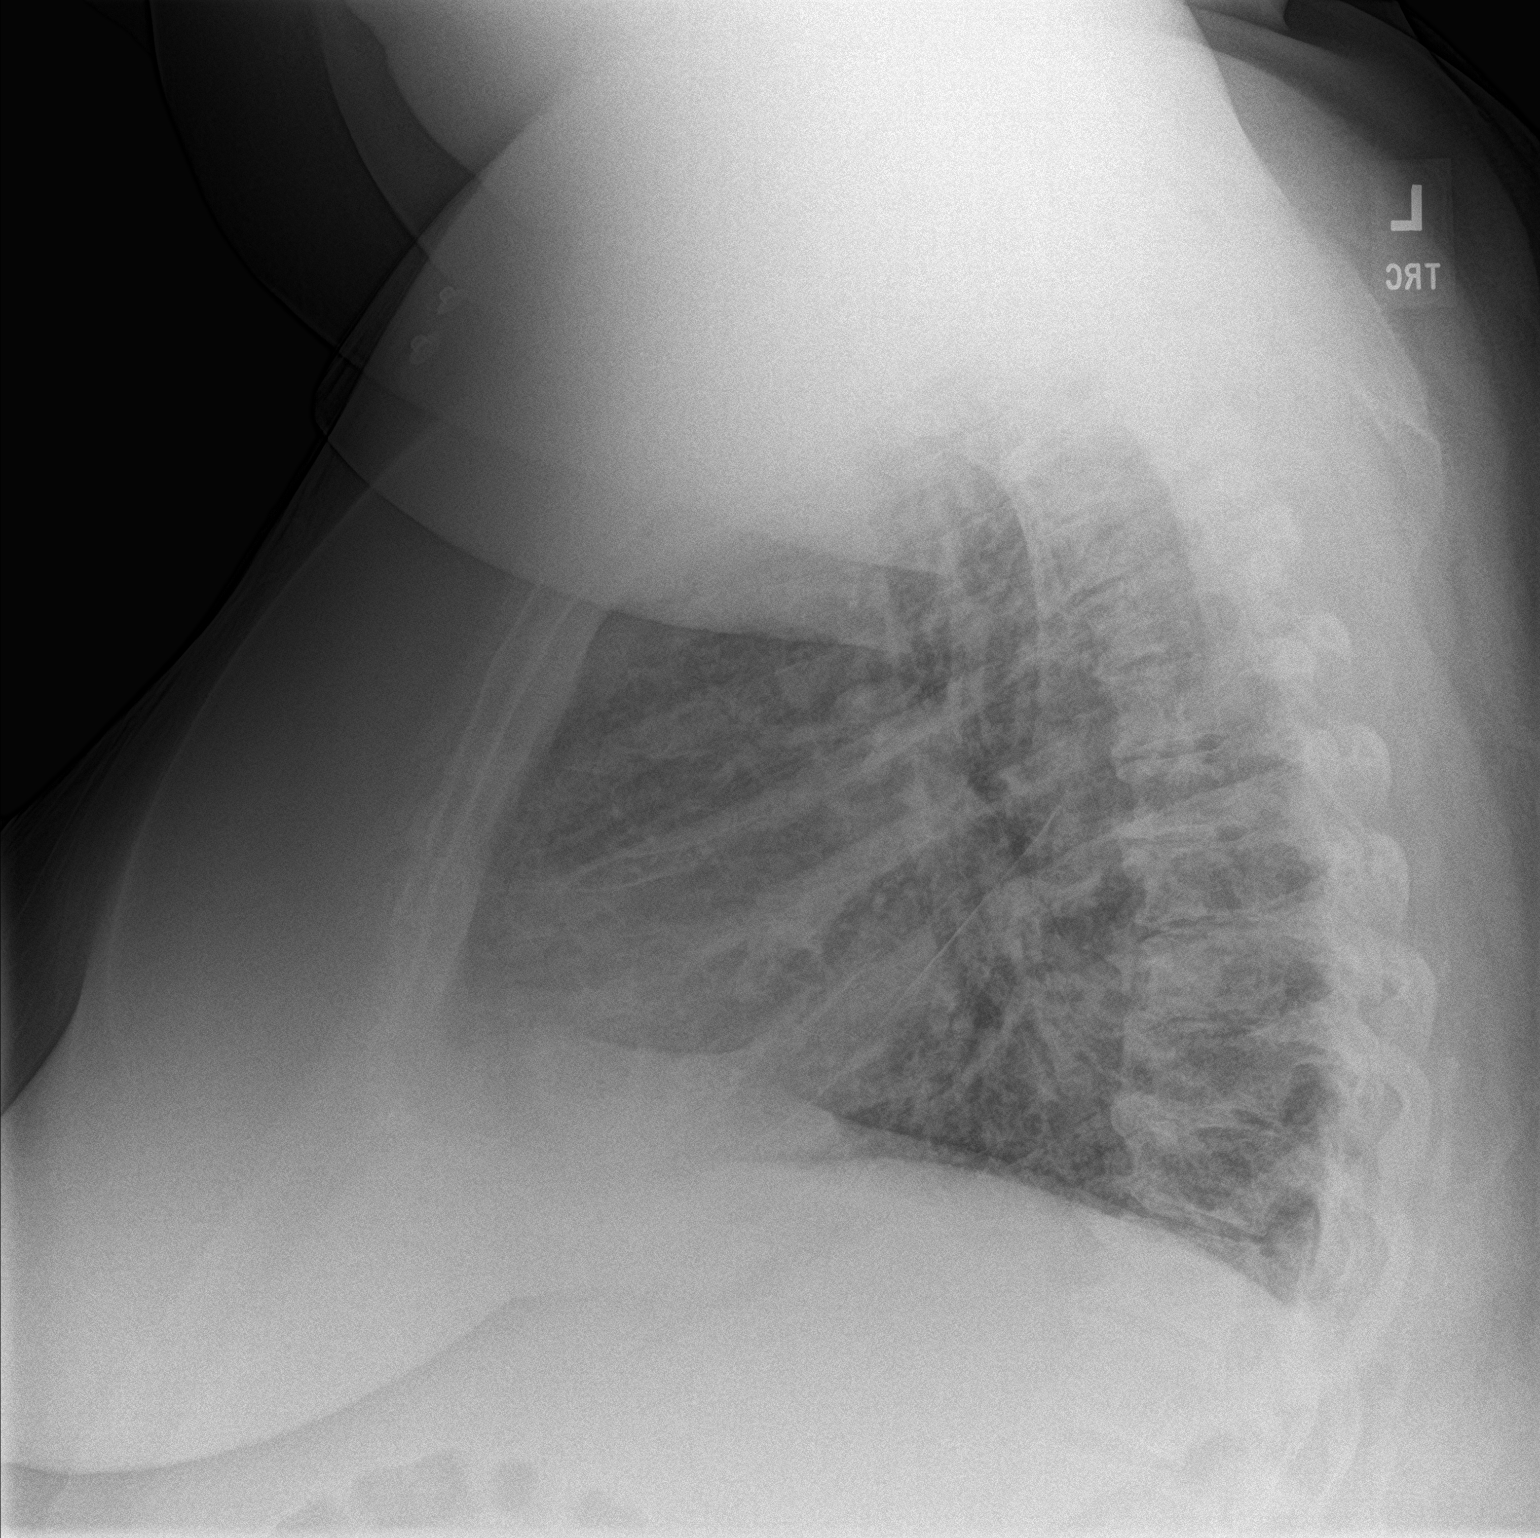

[2 of 2 positions shown; findings below may reference images not displayed]

FINDINGS: Frontal and lateral views of the chest demonstrate an unremarkable
cardiac silhouette. There is increased central vascular congestion
without airspace disease, effusion, or pneumothorax. No acute bony
abnormalities.
IMPRESSION: 1. Central vascular congestion without overt edema.

## 2022-11-03 ENCOUNTER — Other Ambulatory Visit: Payer: Self-pay | Admitting: Primary Care

## 2022-11-03 DIAGNOSIS — Z1231 Encounter for screening mammogram for malignant neoplasm of breast: Secondary | ICD-10-CM

## 2022-11-06 ENCOUNTER — Other Ambulatory Visit: Payer: Self-pay

## 2022-11-06 ENCOUNTER — Emergency Department: Payer: Medicaid Other

## 2022-11-06 ENCOUNTER — Inpatient Hospital Stay
Admission: EM | Admit: 2022-11-06 | Discharge: 2022-11-10 | DRG: 354 | Disposition: A | Discharge status: Home / Self Care | Payer: Medicaid Other | Attending: Surgery | Admitting: Surgery

## 2022-11-06 DIAGNOSIS — K219 Gastro-esophageal reflux disease without esophagitis: Secondary | ICD-10-CM | POA: Diagnosis present

## 2022-11-06 DIAGNOSIS — K436 Other and unspecified ventral hernia with obstruction, without gangrene: Principal | ICD-10-CM

## 2022-11-06 DIAGNOSIS — F32A Depression, unspecified: Secondary | ICD-10-CM | POA: Diagnosis present

## 2022-11-06 DIAGNOSIS — Z6841 Body Mass Index (BMI) 40.0 and over, adult: Secondary | ICD-10-CM

## 2022-11-06 DIAGNOSIS — E7800 Pure hypercholesterolemia, unspecified: Secondary | ICD-10-CM | POA: Diagnosis present

## 2022-11-06 DIAGNOSIS — Z8719 Personal history of other diseases of the digestive system: Secondary | ICD-10-CM

## 2022-11-06 DIAGNOSIS — Z79899 Other long term (current) drug therapy: Secondary | ICD-10-CM

## 2022-11-06 DIAGNOSIS — K430 Incisional hernia with obstruction, without gangrene: Principal | ICD-10-CM | POA: Diagnosis present

## 2022-11-06 DIAGNOSIS — R000 Tachycardia, unspecified: Secondary | ICD-10-CM

## 2022-11-06 DIAGNOSIS — A0472 Enterocolitis due to Clostridium difficile, not specified as recurrent: Secondary | ICD-10-CM | POA: Diagnosis present

## 2022-11-06 DIAGNOSIS — Z7984 Long term (current) use of oral hypoglycemic drugs: Secondary | ICD-10-CM

## 2022-11-06 DIAGNOSIS — Z794 Long term (current) use of insulin: Secondary | ICD-10-CM

## 2022-11-06 DIAGNOSIS — E1165 Type 2 diabetes mellitus with hyperglycemia: Secondary | ICD-10-CM | POA: Diagnosis present

## 2022-11-06 DIAGNOSIS — A0811 Acute gastroenteropathy due to Norwalk agent: Secondary | ICD-10-CM | POA: Diagnosis present

## 2022-11-06 DIAGNOSIS — Z87891 Personal history of nicotine dependence: Secondary | ICD-10-CM

## 2022-11-06 DIAGNOSIS — I10 Essential (primary) hypertension: Secondary | ICD-10-CM | POA: Diagnosis present

## 2022-11-06 LAB — COMPREHENSIVE METABOLIC PANEL
ALT: 39 U/L (ref 0–44)
AST: 36 U/L (ref 15–41)
Albumin: 3.4 g/dL — ABNORMAL LOW (ref 3.5–5.0)
Alkaline Phosphatase: 92 U/L (ref 38–126)
Anion gap: 13 (ref 5–15)
BUN: 11 mg/dL (ref 6–20)
CO2: 25 mmol/L (ref 22–32)
Calcium: 9 mg/dL (ref 8.9–10.3)
Chloride: 95 mmol/L — ABNORMAL LOW (ref 98–111)
Creatinine, Ser: 0.68 mg/dL (ref 0.44–1.00)
GFR, Estimated: >60 mL/min (ref >60)
Glucose, Bld: 395 mg/dL — ABNORMAL HIGH (ref 70–99)
Potassium: 4.3 mmol/L (ref 3.5–5.1)
Sodium: 133 mmol/L — ABNORMAL LOW (ref 135–145)
Total Bilirubin: 0.9 mg/dL (ref 0.3–1.2)
Total Protein: 7.2 g/dL (ref 6.5–8.1)

## 2022-11-06 LAB — URINALYSIS, ROUTINE W REFLEX MICROSCOPIC
Bacteria, UA: NONE SEEN
Bilirubin Urine: NEGATIVE
Glucose, UA: >=500 mg/dL — AB
Ketones, ur: 80 mg/dL — AB
Leukocytes,Ua: NEGATIVE
Nitrite: NEGATIVE
Protein, ur: NEGATIVE mg/dL
Specific Gravity, Urine: 1.033 — ABNORMAL HIGH (ref 1.005–1.030)
pH: 5 (ref 5.0–8.0)

## 2022-11-06 LAB — CBC
HCT: 40.5 % (ref 36.0–46.0)
Hemoglobin: 12.9 g/dL (ref 12.0–15.0)
MCH: 25.4 pg — ABNORMAL LOW (ref 26.0–34.0)
MCHC: 31.9 g/dL (ref 30.0–36.0)
MCV: 79.9 fL — ABNORMAL LOW (ref 80.0–100.0)
Platelets: 408 10*3/uL — ABNORMAL HIGH (ref 150–400)
RBC: 5.07 MIL/uL (ref 3.87–5.11)
RDW: 15.1 % (ref 11.5–15.5)
WBC: 11.5 10*3/uL — ABNORMAL HIGH (ref 4.0–10.5)
nRBC: 0 % (ref 0.0–0.2)

## 2022-11-06 LAB — POC URINE PREG, ED: Preg Test, Ur: NEGATIVE

## 2022-11-06 LAB — LIPASE, BLOOD: Lipase: 28 U/L (ref 11–51)

## 2022-11-06 MED ORDER — SODIUM CHLORIDE 0.9 % IV BOLUS
1000.0000 mL | Freq: Once | INTRAVENOUS | Status: AC
  Administered 2022-11-06: 1000 mL via INTRAVENOUS

## 2022-11-06 MED ORDER — METOPROLOL TARTRATE 5 MG/5ML IV SOLN
5.0000 mg | Freq: Once | INTRAVENOUS | Status: AC
  Administered 2022-11-07: 5 mg via INTRAVENOUS
  Filled 2022-11-06: qty 5

## 2022-11-06 MED ORDER — IOHEXOL 350 MG/ML SOLN
100.0000 mL | Freq: Once | INTRAVENOUS | Status: AC | PRN
  Administered 2022-11-06: 100 mL via INTRAVENOUS

## 2022-11-06 MED ORDER — HYDROMORPHONE HCL 1 MG/ML IJ SOLN
1.0000 mg | Freq: Once | INTRAMUSCULAR | Status: AC
  Administered 2022-11-06: 1 mg via INTRAVENOUS
  Filled 2022-11-06: qty 1

## 2022-11-06 MED ORDER — METOPROLOL TARTRATE 25 MG PO TABS
25.0000 mg | ORAL_TABLET | Freq: Once | ORAL | Status: AC
  Administered 2022-11-07: 25 mg via ORAL
  Filled 2022-11-06: qty 1

## 2022-11-06 MED ORDER — ONDANSETRON HCL 4 MG/2ML IJ SOLN
4.0000 mg | Freq: Once | INTRAMUSCULAR | Status: AC
  Administered 2022-11-06: 4 mg via INTRAVENOUS
  Filled 2022-11-06: qty 2

## 2022-11-06 MED ORDER — MORPHINE SULFATE (PF) 4 MG/ML IV SOLN
4.0000 mg | Freq: Once | INTRAVENOUS | Status: AC
  Administered 2022-11-06: 4 mg via INTRAVENOUS
  Filled 2022-11-06: qty 1

## 2022-11-06 MED ORDER — KETOROLAC TROMETHAMINE 15 MG/ML IJ SOLN
15.0000 mg | Freq: Once | INTRAMUSCULAR | Status: AC
  Administered 2022-11-06: 15 mg via INTRAVENOUS
  Filled 2022-11-06: qty 1

## 2022-11-06 NOTE — ED Notes (Signed)
Pt was stuck twice for bloodwork in triage, brought to room, primary nurse made aware.

## 2022-11-06 NOTE — ED Notes (Signed)
UA obtained. Pregnancy test negative, patient medicated for worsening pain. CT called to notify of negative preg and ready for scan. VSS, call light within reach.

## 2022-11-06 NOTE — ED Provider Notes (Signed)
[facility] Provider Note    Event Date/Time   First MD Initiated Contact with Patient 11/06/22 1843     (approximate)   History   Chief Complaint: Abdominal Pain   HPI  Destiny Anderson is a 52 y.o. female with a history of hypertension diabetes morbid obesity who comes ED complaining of mid abdominal pain that started yesterday after eating.  She has had multiple episodes of vomiting and been unable to take her medication since then.  Last bowel movement was yesterday which she reports is abnormal as she typically has multiple per day.  No fever chest pain shortness of breath.  No trauma.     Physical Exam   Triage Vital Signs: ED Triage Vitals  Encounter Vitals Group     BP 11/06/22 1821 (!) 138/56     Systolic BP Percentile --      Diastolic BP Percentile --      Pulse Rate 11/06/22 1821 (!) 132     Resp 11/06/22 1821 18     Temp 11/06/22 1821 98.9 F (37.2 C)     Temp Source 11/06/22 1821 Oral     SpO2 11/06/22 1821 97 %     Weight 11/06/22 2020 (!) 341 lb 11.4 oz (155 kg)     Height 11/06/22 2020 5\' 3"  (1.6 m)     Head Circumference --      Peak Flow --      Pain Score 11/06/22 1822 7     Pain Loc --      Pain Education --      Exclude from Growth Chart --     Most recent vital signs: Vitals:   11/06/22 2300 11/06/22 2315  BP: 129/71   Pulse: (!) 121 (!) 128  Resp: 18 (!) 21  Temp:    SpO2: 93% 94%    General: Awake, no distress.  CV:  Good peripheral perfusion.  Tachycardia heart rate 120 Resp:  Normal effort.  Abd:  No distention.  Large pannus.  No palpable focal mass.  There is tenderness in the left lower quadrant.    ED Results / Procedures / Treatments   Labs (all labs ordered are listed, but only abnormal results are displayed) Labs Reviewed  COMPREHENSIVE METABOLIC PANEL - Abnormal; Notable for the following components:      Result Value   Sodium 133 (*)    Chloride 95 (*)    Glucose, Bld 395 (*)     Albumin 3.4 (*)    All other components within normal limits  CBC - Abnormal; Notable for the following components:   WBC 11.5 (*)    MCV 79.9 (*)    MCH 25.4 (*)    Platelets 408 (*)    All other components within normal limits  URINALYSIS, ROUTINE W REFLEX MICROSCOPIC - Abnormal; Notable for the following components:   Color, Urine YELLOW (*)    APPearance HAZY (*)    Specific Gravity, Urine 1.033 (*)    Glucose, UA >=500 (*)    Hgb urine dipstick LARGE (*)    Ketones, ur 80 (*)    All other components within normal limits  LIPASE, BLOOD  POC URINE PREG, ED     EKG Interpreted by me Sinus tachycardia, rate 138.  Normal axis, normal intervals.  Normal QRS ST segments and T waves.   RADIOLOGY CT abdomen pelvis interpreted by me, shows ventral hernia.  Radiology report reviewed noting changes of partial small  bowel obstruction proximal to the ventral hernia   PROCEDURES:  Procedures   MEDICATIONS ORDERED IN ED: Medications  sodium chloride 0.9 % bolus 1,000 mL (0 mLs Intravenous Stopped 11/06/22 2300)  ketorolac (TORADOL) 15 MG/ML injection 15 mg (15 mg Intravenous Given 11/06/22 1955)  ondansetron (ZOFRAN) injection 4 mg (4 mg Intravenous Given 11/06/22 1955)  morphine (PF) 4 MG/ML injection 4 mg (4 mg Intravenous Given 11/06/22 2113)  iohexol (OMNIPAQUE) 350 MG/ML injection 100 mL (100 mLs Intravenous Contrast Given 11/06/22 2127)  HYDROmorphone (DILAUDID) injection 1 mg (1 mg Intravenous Given 11/06/22 2314)     IMPRESSION / MDM / ASSESSMENT AND PLAN / ED COURSE  I reviewed the triage vital signs and the nursing notes.  DDx: SBO, diverticulitis, hernia, viral syndrome, DKA, dehydration  Patient's presentation is most consistent with acute presentation with potential threat to life or bodily function.  Patient presents with abdominal pain and vomiting.  Super morbid obesity limits exam.  Labs show hyperglycemia, evidence of dehydration with urinary concentration.  No  acidosis.  CT reveals ventral hernia with changes of bowel obstruction proximal to the hernia.  After reviewing the CT, I attempted to reduce the hernia at bedside, unsuccessful.  Presentation and findings discussed with Dr. Tonna Boehringer of surgery who was able to reduce the hernia at bedside.  He will admit for overnight observation.  Suspect patient's tachycardia is related to not being able to take her metoprolol.  Will give a dose here in the ED.      FINAL CLINICAL IMPRESSION(S) / ED DIAGNOSES   Final diagnoses:  Ventral hernia with obstruction and without gangrene  Morbid obesity ([facility])     Rx / DC Orders   ED Discharge Orders     None        Note:  This document was prepared using Dragon voice recognition software and may include unintentional dictation errors.   Sharman Cheek, MD 11/06/22 [phone]

## 2022-11-06 NOTE — ED Notes (Signed)
Patient medicated for pain. Surgery at bedside to evaluate patient.

## 2022-11-06 NOTE — ED Triage Notes (Addendum)
Pt c/o lower-mid abdominal pain that started yesterday after eating cereal. Pt threw up bile yesterday. Pt denies CP, SHOB, dizziness. Pt does not have gallbladder, has appendix. Pt AOX4, diaphoretic, respirations even and unlabored. Abdomen is obese, soft to palpation, no tenderness noted.

## 2022-11-06 NOTE — H&P (Signed)
Subjective:   CC: Ventral hernia  HPI:  Destiny Anderson is a 52 y.o. female who was referred by [facility] for evaluation of above cc.   Symptoms were first noted 2 days ago. Pain is sharp, localized slightly left to umbilical area.  Associated with nausea, exacerbated by touch.    Past Medical History:  has a past medical history of Depression, Diabetes mellitus without complication (HCC), Hypercholesteremia, and Hypertension.  Past Surgical History:  Past Surgical History:  Procedure Laterality Date   CESAREAN SECTION     x4   CHOLECYSTECTOMY     HERNIA REPAIR      Family History: family history is not on file.  Social History:  reports that she has quit smoking. She has never used smokeless tobacco. She reports that she does not drink alcohol and does not use drugs.  Current Medications:  Prior to Admission medications   Medication Sig Start Date End Date Taking? Authorizing Provider  Ascorbic Acid (VITAMIN C) 1000 MG tablet Take 1,000 mg by mouth daily.    [provider]  B Complex-C (B-COMPLEX WITH VITAMIN C) tablet Take 1 tablet by mouth daily.    [provider]  esomeprazole (NEXIUM) 40 MG capsule Take 40 mg by mouth daily at 12 noon.    [provider]  ferrous sulfate 325 (65 FE) MG EC tablet Take 1 tablet (325 mg total) by mouth daily with breakfast. 07/09/20   Phineas Semen, MD  furosemide (LASIX) 40 MG tablet Take 1 tablet (40 mg total) by mouth daily. 07/09/20 07/09/21  Phineas Semen, MD  gabapentin (NEURONTIN) 300 MG capsule Take 300 mg by mouth 2 (two) times daily.    [provider]  HYDROcodone-acetaminophen (NORCO) 5-325 MG tablet Take 1 tablet by mouth every 6 (six) hours as needed for up to 7 doses for severe pain. 06/02/17   Merrily Brittle, MD  ibuprofen (ADVIL,MOTRIN) 600 MG tablet Take 1 tablet (600 mg total) by mouth every 8 (eight) hours as needed. 06/02/17   Merrily Brittle, MD  insulin aspart (NOVOLOG) 100 UNIT/ML  injection Inject 15 Units into the skin 2 (two) times daily.    [provider]  insulin detemir (LEVEMIR) 100 UNIT/ML injection Inject 53 Units into the skin 2 (two) times daily.    [provider]  linagliptin (TRADJENTA) 5 MG TABS tablet Take 5 mg by mouth daily.    [provider]  lisinopril-hydrochlorothiazide (PRINZIDE,ZESTORETIC) 20-12.5 MG tablet Take 1 tablet by mouth daily.    [provider]  metFORMIN (GLUCOPHAGE) 1000 MG tablet Take 1,000 mg by mouth 2 (two) times daily with a meal.    [provider]  metoprolol succinate (TOPROL-XL) 25 MG 24 hr tablet Take 25 mg by mouth daily.    [provider]  omeprazole (PRILOSEC) 40 MG capsule Take 40 mg by mouth daily.    [provider]  oxyCODONE-acetaminophen (ROXICET) 5-325 MG tablet Take 1 tablet by mouth every 4 (four) hours as needed for severe pain. 10/09/16   Irean Hong, MD  pravastatin (PRAVACHOL) 40 MG tablet Take 40 mg by mouth daily.    [provider]  venlafaxine XR (EFFEXOR-XR) 150 MG 24 hr capsule Take 150 mg by mouth daily with breakfast.    [provider]    Allergies:  Allergies as of 11/06/2022   (No Known Allergies)    ROS:  General: Denies weight loss, weight gain, fatigue, fevers, chills, and night sweats. Eyes: Denies blurry  vision, double vision, eye pain, itchy eyes, and tearing. Ears: Denies hearing loss, earache, and ringing in ears. Nose: Denies sinus pain, congestion, infections, runny nose, and nosebleeds. Mouth/throat: Denies hoarseness, sore throat, bleeding gums, and difficulty swallowing. Heart: Denies chest pain, palpitations, racing heart, irregular heartbeat, leg pain or swelling, and decreased activity tolerance. Respiratory: Denies breathing difficulty, shortness of breath, wheezing, cough, and sputum. GI: Denies change in appetite, heartburn, nausea, vomiting, constipation, diarrhea, and blood in stool. GU:  Denies difficulty urinating, pain with urinating, urgency, frequency, blood in urine. Musculoskeletal: Denies joint stiffness, pain, swelling, muscle weakness. Skin: Denies rash, itching, mass, tumors, sores, and boils Neurologic: Denies headache, fainting, dizziness, seizures, numbness, and tingling. Psychiatric: Denies depression, anxiety, difficulty sleeping, and memory loss. Endocrine: Denies heat or cold intolerance, and increased thirst or urination. Blood/lymph: Denies easy bruising, easy bruising, and swollen glands     Objective:     BP 129/71   Pulse (!) 128   Temp 99 F (37.2 C) (Oral)   Resp (!) 21   Ht 5\' 3"  (1.6 m)   Wt (!) 155 kg   LMP 11/06/2022 (Exact Date)   SpO2 94%   BMI 60.53 kg/m   Constitutional :  alert, cooperative, appears stated age, and no distress  Lymphatics/Throat:  no asymmetry, masses, or scars  Respiratory:  clear to auscultation bilaterally  Cardiovascular:  Tachycardia rate  Gastrointestinal: Soft, no guarding, extremely difficult physical exam secondary to body habitus but by triangulating based on CT images he will to palpate general area.  During palpation, audible bowel sounds noted with a sudden increase softness in the area of palpation.  Repalpation of the area noted to have a definitive increase softness compared to the other side or when initially palpated.  Patient was given pain medication shortly before exam, unable to tell if current exam with lack of tenderness to palpation secondary to medications or if the hernia was able to be reduced .   Musculoskeletal: lying in bed, no obvious difficulty moving upper extremities  Skin: Cool and moist  Psychiatric: Normal affect, non-agitated, not confused       LABS:     Latest Ref Rng & Units 11/06/2022    6:25 PM 07/09/2020   12:47 AM 10/09/2016    1:34 AM  CMP  Glucose 70 - 99 mg/dL 914  782  956   BUN 6 - 20 mg/dL 11  9  9    Creatinine 0.44 - 1.00 mg/dL 2.13  0.86  5.78   Sodium 135 -  145 mmol/L 133  134  135   Potassium 3.5 - 5.1 mmol/L 4.3  4.2  3.6   Chloride 98 - 111 mmol/L 95  104  103   CO2 22 - 32 mmol/L 25  21  18    Calcium 8.9 - 10.3 mg/dL 9.0  8.5  8.4   Total Protein 6.5 - 8.1 g/dL 7.2  7.2    Total Bilirubin 0.3 - 1.2 mg/dL 0.9  0.4    Alkaline Phos 38 - 126 U/L 92  80    AST 15 - 41 U/L 36  23    ALT 0 - 44 U/L 39  19        Latest Ref Rng & Units 11/06/2022    6:25 PM 07/09/2020   12:47 AM 10/22/2014    2:23 PM  CBC  WBC 4.0 - 10.5 K/uL 11.5  9.8  9.7   Hemoglobin 12.0 - 15.0 g/dL 46.9  8.8  10.6   Hematocrit 36.0 - 46.0 % 40.5  29.8  33.6   Platelets 150 - 400 K/uL 408  455  374     RADS: CLINICAL DATA:  Left lower quadrant pain for 1 day   EXAM: CT ABDOMEN AND PELVIS WITH CONTRAST   TECHNIQUE: Multidetector CT imaging of the abdomen and pelvis was performed using the standard protocol following bolus administration of intravenous contrast.   RADIATION DOSE REDUCTION: This exam was performed according to the departmental dose-optimization program which includes automated exposure control, adjustment of the mA and/or kV according to patient size and/or use of iterative reconstruction technique.   CONTRAST:  OMNIPAQUE IOHEXOL 350 MG/ML SOLN   COMPARISON:  None Available.   FINDINGS: Lower chest: No acute abnormality. Small posterior diaphragmatic hernia is noted on the left containing fat.   Hepatobiliary: No focal liver abnormality is seen. Status post cholecystectomy. No biliary dilatation.   Pancreas: Unremarkable. No pancreatic ductal dilatation or surrounding inflammatory changes.   Spleen: Normal in size without focal abnormality.   Adrenals/Urinary Tract: Adrenal glands are within normal limits. Kidneys are well visualized bilaterally. No renal calculi or obstructive changes are noted. Bladder is decompressed.   Stomach/Bowel: No obstructive or inflammatory changes of the colon are noted. Large anterior abdominal  hernia is noted just above an area prior hernia repair. A loop of small bowel is noted within. Fecalization of bowel contents is noted proximal this as well as some mild fluid dilatation of the more proximal small bowel. These changes are consistent with a partial small bowel obstruction secondary to the herniation. The stomach is within normal limits.   Vascular/Lymphatic: Aortic atherosclerosis. No enlarged abdominal or pelvic lymph nodes.   Reproductive: Uterus and bilateral adnexa are unremarkable.   Other: No ascites is noted.   Musculoskeletal: Degenerative changes of lumbar spine are noted.   IMPRESSION: Changes consistent with partial small bowel obstruction secondary to a herniated loop within a ventral hernia in the inferior abdominal wall just above an area of prior hernia repair.     Electronically Signed   By: Alcide Clever M.D.   On: 11/06/2022 21:42 Assessment:   Morbid obesity  Incarcerated ventral hernia, likely was reduced at bedside during exam.  Tachycardia  Borderline oxygen saturations Plan:   Recommend the patient be observed overnight secondary to lab abnormalities indicating some sort of stress reaction as well as her persistent tachycardia.  Of note, patient does report she has been unable to take her blood pressure medications including metoprolol for the past couple days secondary to the nausea.  Tachycardia hopefully is rebound tachycardia.  Will resume her home meds to see if any improvement.  Will also continue to monitor her O2 saturation.  Continue with serial abdominal exams to make sure no recurrence of the hernia.  I was frank with her stating that she will be an extremely difficult surgical case secondary to her extreme obesity, and that any sort of surgical repair would likely be temporary and will not last long term.  She verbalized understanding and is seriously considering a bariatric surgery consultation for further weight loss since she  has failed medical options in the past.  labs/images/medications/previous chart entries reviewed personally and relevant changes/updates noted above.

## 2022-11-07 ENCOUNTER — Inpatient Hospital Stay: Payer: Medicaid Other

## 2022-11-07 ENCOUNTER — Observation Stay: Payer: Medicaid Other | Admitting: Anesthesiology

## 2022-11-07 ENCOUNTER — Encounter: Payer: Self-pay | Admitting: Surgery

## 2022-11-07 ENCOUNTER — Encounter
Admission: EM | Disposition: A | Discharge status: Home / Self Care | Payer: Self-pay | Source: Home / Self Care | Attending: Surgery

## 2022-11-07 ENCOUNTER — Other Ambulatory Visit: Payer: Self-pay

## 2022-11-07 DIAGNOSIS — Z7984 Long term (current) use of oral hypoglycemic drugs: Secondary | ICD-10-CM | POA: Diagnosis not present

## 2022-11-07 DIAGNOSIS — I10 Essential (primary) hypertension: Secondary | ICD-10-CM | POA: Diagnosis present

## 2022-11-07 DIAGNOSIS — Z79899 Other long term (current) drug therapy: Secondary | ICD-10-CM | POA: Diagnosis not present

## 2022-11-07 DIAGNOSIS — F32A Depression, unspecified: Secondary | ICD-10-CM | POA: Diagnosis present

## 2022-11-07 DIAGNOSIS — K436 Other and unspecified ventral hernia with obstruction, without gangrene: Secondary | ICD-10-CM | POA: Diagnosis present

## 2022-11-07 DIAGNOSIS — A0811 Acute gastroenteropathy due to Norwalk agent: Secondary | ICD-10-CM | POA: Diagnosis present

## 2022-11-07 DIAGNOSIS — Z8719 Personal history of other diseases of the digestive system: Secondary | ICD-10-CM

## 2022-11-07 DIAGNOSIS — K430 Incisional hernia with obstruction, without gangrene: Secondary | ICD-10-CM | POA: Diagnosis present

## 2022-11-07 DIAGNOSIS — K219 Gastro-esophageal reflux disease without esophagitis: Secondary | ICD-10-CM | POA: Diagnosis present

## 2022-11-07 DIAGNOSIS — A0472 Enterocolitis due to Clostridium difficile, not specified as recurrent: Secondary | ICD-10-CM | POA: Diagnosis present

## 2022-11-07 DIAGNOSIS — E1165 Type 2 diabetes mellitus with hyperglycemia: Secondary | ICD-10-CM | POA: Diagnosis present

## 2022-11-07 DIAGNOSIS — E7800 Pure hypercholesterolemia, unspecified: Secondary | ICD-10-CM | POA: Diagnosis present

## 2022-11-07 DIAGNOSIS — Z794 Long term (current) use of insulin: Secondary | ICD-10-CM | POA: Diagnosis not present

## 2022-11-07 DIAGNOSIS — Z6841 Body Mass Index (BMI) 40.0 and over, adult: Secondary | ICD-10-CM | POA: Diagnosis not present

## 2022-11-07 DIAGNOSIS — R000 Tachycardia, unspecified: Secondary | ICD-10-CM

## 2022-11-07 DIAGNOSIS — Z87891 Personal history of nicotine dependence: Secondary | ICD-10-CM | POA: Diagnosis not present

## 2022-11-07 HISTORY — PX: VENTRAL HERNIA REPAIR: SHX424

## 2022-11-07 HISTORY — PX: INSERTION OF MESH: SHX5868

## 2022-11-07 LAB — BASIC METABOLIC PANEL
Anion gap: 11 (ref 5–15)
BUN: 21 mg/dL — ABNORMAL HIGH (ref 6–20)
CO2: 26 mmol/L (ref 22–32)
Calcium: 7.9 mg/dL — ABNORMAL LOW (ref 8.9–10.3)
Chloride: 100 mmol/L (ref 98–111)
Creatinine, Ser: 0.8 mg/dL (ref 0.44–1.00)
GFR, Estimated: >60 mL/min (ref >60)
Glucose, Bld: 150 mg/dL — ABNORMAL HIGH (ref 70–99)
Potassium: 3.4 mmol/L — ABNORMAL LOW (ref 3.5–5.1)
Sodium: 137 mmol/L (ref 135–145)

## 2022-11-07 LAB — COMPREHENSIVE METABOLIC PANEL
ALT: 43 U/L (ref 0–44)
AST: 40 U/L (ref 15–41)
Albumin: 3.3 g/dL — ABNORMAL LOW (ref 3.5–5.0)
Alkaline Phosphatase: 100 U/L (ref 38–126)
Anion gap: 13 (ref 5–15)
BUN: 19 mg/dL (ref 6–20)
CO2: 23 mmol/L (ref 22–32)
Calcium: 8.8 mg/dL — ABNORMAL LOW (ref 8.9–10.3)
Chloride: 98 mmol/L (ref 98–111)
Creatinine, Ser: 0.85 mg/dL (ref 0.44–1.00)
GFR, Estimated: >60 mL/min (ref >60)
Glucose, Bld: 420 mg/dL — ABNORMAL HIGH (ref 70–99)
Potassium: 4.6 mmol/L (ref 3.5–5.1)
Sodium: 134 mmol/L — ABNORMAL LOW (ref 135–145)
Total Bilirubin: 0.7 mg/dL (ref 0.3–1.2)
Total Protein: 7.5 g/dL (ref 6.5–8.1)

## 2022-11-07 LAB — CBG MONITORING, ED
Glucose-Capillary: 381 mg/dL — ABNORMAL HIGH (ref 70–99)
Glucose-Capillary: 355 mg/dL — ABNORMAL HIGH (ref 70–99)
Glucose-Capillary: 283 mg/dL — ABNORMAL HIGH (ref 70–99)

## 2022-11-07 LAB — CBC
HCT: 41.4 % (ref 36.0–46.0)
HCT: 36.2 % (ref 36.0–46.0)
Hemoglobin: 12.8 g/dL (ref 12.0–15.0)
Hemoglobin: 11.2 g/dL — ABNORMAL LOW (ref 12.0–15.0)
MCH: 25.8 pg — ABNORMAL LOW (ref 26.0–34.0)
MCH: 25.5 pg — ABNORMAL LOW (ref 26.0–34.0)
MCHC: 30.9 g/dL (ref 30.0–36.0)
MCHC: 30.9 g/dL (ref 30.0–36.0)
MCV: 83.3 fL (ref 80.0–100.0)
MCV: 82.5 fL (ref 80.0–100.0)
Platelets: 364 10*3/uL (ref 150–400)
Platelets: 342 10*3/uL (ref 150–400)
RBC: 4.97 MIL/uL (ref 3.87–5.11)
RBC: 4.39 MIL/uL (ref 3.87–5.11)
RDW: 15.2 % (ref 11.5–15.5)
RDW: 15.5 % (ref 11.5–15.5)
WBC: 10.1 10*3/uL (ref 4.0–10.5)
WBC: 6.2 10*3/uL (ref 4.0–10.5)
nRBC: 0 % (ref 0.0–0.2)
nRBC: 0 % (ref 0.0–0.2)

## 2022-11-07 LAB — TSH: TSH: 1.879 u[IU]/mL (ref 0.350–4.500)

## 2022-11-07 LAB — HEMOGLOBIN A1C
Hgb A1c MFr Bld: 8.5 % — ABNORMAL HIGH (ref 4.8–5.6)
Mean Plasma Glucose: 197.25 mg/dL

## 2022-11-07 LAB — BRAIN NATRIURETIC PEPTIDE: B Natriuretic Peptide: 10.1 pg/mL (ref 0.0–100.0)

## 2022-11-07 LAB — GLUCOSE, CAPILLARY
Glucose-Capillary: 320 mg/dL — ABNORMAL HIGH (ref 70–99)
Glucose-Capillary: 351 mg/dL — ABNORMAL HIGH (ref 70–99)
Glucose-Capillary: 203 mg/dL — ABNORMAL HIGH (ref 70–99)
Glucose-Capillary: 168 mg/dL — ABNORMAL HIGH (ref 70–99)

## 2022-11-07 LAB — HIV ANTIBODY (ROUTINE TESTING W REFLEX): HIV Screen 4th Generation wRfx: NONREACTIVE

## 2022-11-07 LAB — PROTIME-INR
INR: 1.1 (ref 0.8–1.2)
Prothrombin Time: 14.1 s (ref 11.4–15.2)

## 2022-11-07 LAB — TROPONIN I (HIGH SENSITIVITY): Troponin I (High Sensitivity): 7 ng/L (ref <18)

## 2022-11-07 LAB — D-DIMER, QUANTITATIVE: D-Dimer, Quant: 5.02 ug{FEU}/mL — ABNORMAL HIGH (ref 0.00–0.50)

## 2022-11-07 SURGERY — REPAIR, HERNIA, VENTRAL
Anesthesia: General

## 2022-11-07 MED ORDER — LISINOPRIL-HYDROCHLOROTHIAZIDE 20-12.5 MG PO TABS: 1.0000 | ORAL_TABLET | Freq: Every day | ORAL | Status: DC

## 2022-11-07 MED ORDER — ALBUTEROL SULFATE HFA 108 (90 BASE) MCG/ACT IN AERS
INHALATION_SPRAY | RESPIRATORY_TRACT | Status: AC
  Filled 2022-11-07: qty 6.7

## 2022-11-07 MED ORDER — CEFAZOLIN SODIUM 1 G IJ SOLR
INTRAMUSCULAR | Status: AC
  Filled 2022-11-07: qty 30

## 2022-11-07 MED ORDER — PHENYLEPHRINE HCL-NACL 20-0.9 MG/250ML-% IV SOLN
INTRAVENOUS | Status: AC
  Filled 2022-11-07: qty 250

## 2022-11-07 MED ORDER — ARIPIPRAZOLE 10 MG PO TABS
5.0000 mg | ORAL_TABLET | Freq: Every day | ORAL | Status: DC
  Filled 2022-11-07 (×2): qty 1

## 2022-11-07 MED ORDER — SUGAMMADEX SODIUM 200 MG/2ML IV SOLN
INTRAVENOUS | Status: DC | PRN
Start: 2022-11-07 — End: 2022-11-07
  Administered 2022-11-07: 300 mg via INTRAVENOUS

## 2022-11-07 MED ORDER — BUPIVACAINE-EPINEPHRINE (PF) 0.5% -1:200000 IJ SOLN
INTRAMUSCULAR | Status: AC
  Filled 2022-11-07: qty 30

## 2022-11-07 MED ORDER — METOPROLOL SUCCINATE ER 25 MG PO TB24
25.0000 mg | ORAL_TABLET | Freq: Every day | ORAL | Status: DC
  Administered 2022-11-07 – 2022-11-10 (×4): 25 mg via ORAL
  Filled 2022-11-07 (×4): qty 1

## 2022-11-07 MED ORDER — 0.9 % SODIUM CHLORIDE (POUR BTL) OPTIME
TOPICAL | Status: DC | PRN
  Administered 2022-11-07: 500 mL

## 2022-11-07 MED ORDER — HYDROCHLOROTHIAZIDE 12.5 MG PO TABS
12.5000 mg | ORAL_TABLET | Freq: Every day | ORAL | Status: DC
  Administered 2022-11-08 – 2022-11-10 (×3): 12.5 mg via ORAL
  Filled 2022-11-07 (×4): qty 1

## 2022-11-07 MED ORDER — ALBUTEROL SULFATE HFA 108 (90 BASE) MCG/ACT IN AERS
INHALATION_SPRAY | RESPIRATORY_TRACT | Status: DC | PRN
Start: 2022-11-07 — End: 2022-11-07
  Administered 2022-11-07 (×3): 4 via RESPIRATORY_TRACT

## 2022-11-07 MED ORDER — FUROSEMIDE 40 MG PO TABS
40.0000 mg | ORAL_TABLET | Freq: Every day | ORAL | Status: DC
  Administered 2022-11-07 – 2022-11-09 (×3): 40 mg via ORAL
  Filled 2022-11-07 (×3): qty 1

## 2022-11-07 MED ORDER — MIDAZOLAM HCL 2 MG/2ML IJ SOLN
INTRAMUSCULAR | Status: DC | PRN
  Administered 2022-11-07: 2 mg via INTRAVENOUS

## 2022-11-07 MED ORDER — PANTOPRAZOLE SODIUM 40 MG PO TBEC
40.0000 mg | DELAYED_RELEASE_TABLET | Freq: Every day | ORAL | Status: DC
  Administered 2022-11-07 – 2022-11-10 (×4): 40 mg via ORAL
  Filled 2022-11-07 (×4): qty 1

## 2022-11-07 MED ORDER — ACETAMINOPHEN 10 MG/ML IV SOLN
INTRAVENOUS | Status: AC
  Filled 2022-11-07: qty 100

## 2022-11-07 MED ORDER — LACTATED RINGERS IV SOLN: INTRAVENOUS | Status: DC

## 2022-11-07 MED ORDER — SODIUM CHLORIDE 0.9 % IV SOLN: INTRAVENOUS | Status: DC

## 2022-11-07 MED ORDER — PHENYLEPHRINE HCL-NACL 20-0.9 MG/250ML-% IV SOLN
INTRAVENOUS | Status: DC | PRN
Start: 2022-11-07 — End: 2022-11-07
  Administered 2022-11-07: 40 ug/min via INTRAVENOUS

## 2022-11-07 MED ORDER — ROCURONIUM BROMIDE 100 MG/10ML IV SOLN
INTRAVENOUS | Status: DC | PRN
  Administered 2022-11-07: 40 mg via INTRAVENOUS
  Administered 2022-11-07: 10 mg via INTRAVENOUS
  Administered 2022-11-07: 20 mg via INTRAVENOUS
  Administered 2022-11-07: 30 mg via INTRAVENOUS

## 2022-11-07 MED ORDER — INSULIN ASPART 100 UNIT/ML IJ SOLN
0.0000 [IU] | Freq: Three times a day (TID) | INTRAMUSCULAR | Status: DC
  Administered 2022-11-07: 20 [IU] via SUBCUTANEOUS
  Administered 2022-11-07: 7 [IU] via SUBCUTANEOUS
  Administered 2022-11-07: 10 [IU] via SUBCUTANEOUS
  Administered 2022-11-08: 7 [IU] via SUBCUTANEOUS
  Administered 2022-11-08 (×2): 11 [IU] via SUBCUTANEOUS
  Administered 2022-11-09: 7 [IU] via SUBCUTANEOUS
  Administered 2022-11-09: 15 [IU] via SUBCUTANEOUS
  Administered 2022-11-09 – 2022-11-10 (×2): 11 [IU] via SUBCUTANEOUS
  Administered 2022-11-10: 20 [IU] via SUBCUTANEOUS
  Filled 2022-11-07 (×10): qty 1

## 2022-11-07 MED ORDER — FENTANYL CITRATE (PF) 100 MCG/2ML IJ SOLN: 25.0000 ug | INTRAMUSCULAR | Status: DC | PRN

## 2022-11-07 MED ORDER — CEFAZOLIN IN SODIUM CHLORIDE 3-0.9 GM/100ML-% IV SOLN
3.0000 g | Freq: Once | INTRAVENOUS | Status: AC
  Administered 2022-11-07: 3 g via INTRAVENOUS
  Filled 2022-11-07: qty 100

## 2022-11-07 MED ORDER — INSULIN ASPART 100 UNIT/ML IJ SOLN: 10.0000 [IU] | Freq: Once | INTRAMUSCULAR | Status: DC

## 2022-11-07 MED ORDER — PHENYLEPHRINE 80 MCG/ML (10ML) SYRINGE FOR IV PUSH (FOR BLOOD PRESSURE SUPPORT)
PREFILLED_SYRINGE | INTRAVENOUS | Status: DC | PRN
  Administered 2022-11-07 (×3): 160 ug via INTRAVENOUS

## 2022-11-07 MED ORDER — TRAMADOL HCL 50 MG PO TABS: 50.0000 mg | ORAL_TABLET | Freq: Four times a day (QID) | ORAL | Status: DC | PRN

## 2022-11-07 MED ORDER — SODIUM CHLORIDE (PF) 0.9 % IJ SOLN
INTRAMUSCULAR | Status: DC | PRN
  Administered 2022-11-07: 80 mL via INTRAMUSCULAR

## 2022-11-07 MED ORDER — INSULIN ASPART 100 UNIT/ML IJ SOLN
INTRAMUSCULAR | Status: AC
  Filled 2022-11-07: qty 1

## 2022-11-07 MED ORDER — ONDANSETRON HCL 4 MG/2ML IJ SOLN: 4.0000 mg | Freq: Once | INTRAMUSCULAR | Status: DC | PRN

## 2022-11-07 MED ORDER — ACETAMINOPHEN 10 MG/ML IV SOLN
INTRAVENOUS | Status: DC | PRN
  Administered 2022-11-07: 1000 mg via INTRAVENOUS

## 2022-11-07 MED ORDER — ONDANSETRON HCL 4 MG/2ML IJ SOLN
INTRAMUSCULAR | Status: DC | PRN
  Administered 2022-11-07: 4 mg via INTRAVENOUS

## 2022-11-07 MED ORDER — LISINOPRIL 20 MG PO TABS
20.0000 mg | ORAL_TABLET | Freq: Every day | ORAL | Status: DC
  Administered 2022-11-08 – 2022-11-10 (×3): 20 mg via ORAL
  Filled 2022-11-07 (×4): qty 1

## 2022-11-07 MED ORDER — OXYCODONE HCL 5 MG/5ML PO SOLN: 5.0000 mg | Freq: Once | ORAL | Status: DC | PRN

## 2022-11-07 MED ORDER — GABAPENTIN 300 MG PO CAPS
300.0000 mg | ORAL_CAPSULE | Freq: Two times a day (BID) | ORAL | Status: DC
  Administered 2022-11-07 – 2022-11-10 (×8): 300 mg via ORAL
  Filled 2022-11-07 (×8): qty 1

## 2022-11-07 MED ORDER — SODIUM CHLORIDE FLUSH 0.9 % IV SOLN
INTRAVENOUS | Status: AC
  Filled 2022-11-07: qty 30

## 2022-11-07 MED ORDER — INSULIN ASPART 100 UNIT/ML IJ SOLN
INTRAMUSCULAR | Status: DC | PRN
Start: 2022-11-07 — End: 2022-11-07
  Administered 2022-11-07: 10 [IU] via INTRAVENOUS
  Administered 2022-11-07: 15 [IU] via INTRAVENOUS

## 2022-11-07 MED ORDER — SUCCINYLCHOLINE CHLORIDE 200 MG/10ML IV SOSY
PREFILLED_SYRINGE | INTRAVENOUS | Status: DC | PRN
  Administered 2022-11-07: 120 mg via INTRAVENOUS

## 2022-11-07 MED ORDER — ONDANSETRON 4 MG PO TBDP
4.0000 mg | ORAL_TABLET | Freq: Four times a day (QID) | ORAL | Status: DC | PRN
  Filled 2022-11-07: qty 1

## 2022-11-07 MED ORDER — DEXAMETHASONE SODIUM PHOSPHATE 10 MG/ML IJ SOLN
INTRAMUSCULAR | Status: DC | PRN
  Administered 2022-11-07: 5 mg via INTRAVENOUS

## 2022-11-07 MED ORDER — ENOXAPARIN SODIUM 80 MG/0.8ML IJ SOSY
75.0000 mg | PREFILLED_SYRINGE | INTRAMUSCULAR | Status: DC
  Administered 2022-11-08 – 2022-11-10 (×3): 75 mg via SUBCUTANEOUS
  Filled 2022-11-07 (×3): qty 0.75

## 2022-11-07 MED ORDER — SODIUM CHLORIDE 0.9 % IV BOLUS
500.0000 mL | Freq: Once | INTRAVENOUS | Status: AC
  Administered 2022-11-08: 500 mL via INTRAVENOUS

## 2022-11-07 MED ORDER — DOCUSATE SODIUM 100 MG PO CAPS: 100.0000 mg | ORAL_CAPSULE | Freq: Two times a day (BID) | ORAL | Status: DC | PRN

## 2022-11-07 MED ORDER — FENTANYL CITRATE (PF) 100 MCG/2ML IJ SOLN
INTRAMUSCULAR | Status: AC
  Filled 2022-11-07: qty 2

## 2022-11-07 MED ORDER — HYDROCODONE-ACETAMINOPHEN 5-325 MG PO TABS
1.0000 | ORAL_TABLET | Freq: Four times a day (QID) | ORAL | Status: DC | PRN
  Administered 2022-11-07 – 2022-11-08 (×3): 1 via ORAL
  Filled 2022-11-07 (×3): qty 1

## 2022-11-07 MED ORDER — PHENYLEPHRINE 80 MCG/ML (10ML) SYRINGE FOR IV PUSH (FOR BLOOD PRESSURE SUPPORT)
PREFILLED_SYRINGE | INTRAVENOUS | Status: AC
  Filled 2022-11-07: qty 10

## 2022-11-07 MED ORDER — PROPOFOL 10 MG/ML IV BOLUS
INTRAVENOUS | Status: DC | PRN
Start: 2022-11-07 — End: 2022-11-07
  Administered 2022-11-07: 150 mg via INTRAVENOUS

## 2022-11-07 MED ORDER — GLYCOPYRROLATE 0.2 MG/ML IJ SOLN
INTRAMUSCULAR | Status: DC | PRN
  Administered 2022-11-07: .2 mg via INTRAVENOUS

## 2022-11-07 MED ORDER — LIDOCAINE HCL (CARDIAC) PF 100 MG/5ML IV SOSY
PREFILLED_SYRINGE | INTRAVENOUS | Status: DC | PRN
  Administered 2022-11-07: 100 mg via INTRAVENOUS

## 2022-11-07 MED ORDER — BUPIVACAINE LIPOSOME 1.3 % IJ SUSP
INTRAMUSCULAR | Status: AC
  Filled 2022-11-07: qty 20

## 2022-11-07 MED ORDER — LIDOCAINE HCL (PF) 2 % IJ SOLN
INTRAMUSCULAR | Status: AC
  Filled 2022-11-07: qty 5

## 2022-11-07 MED ORDER — PHENYLEPHRINE HCL (PRESSORS) 10 MG/ML IV SOLN
INTRAVENOUS | Status: AC
  Filled 2022-11-07: qty 1

## 2022-11-07 MED ORDER — METOPROLOL SUCCINATE ER 25 MG PO TB24: 25.0000 mg | ORAL_TABLET | Freq: Every day | ORAL | Status: DC

## 2022-11-07 MED ORDER — OXYCODONE HCL 5 MG PO TABS: 5.0000 mg | ORAL_TABLET | Freq: Once | ORAL | Status: DC | PRN

## 2022-11-07 MED ORDER — INSULIN ASPART 100 UNIT/ML IJ SOLN
10.0000 [IU] | Freq: Once | INTRAMUSCULAR | Status: AC
  Administered 2022-11-07: 10 [IU] via SUBCUTANEOUS

## 2022-11-07 MED ORDER — PRAVASTATIN SODIUM 20 MG PO TABS
40.0000 mg | ORAL_TABLET | Freq: Every day | ORAL | Status: DC
  Administered 2022-11-07 – 2022-11-10 (×4): 40 mg via ORAL
  Filled 2022-11-07 (×4): qty 2

## 2022-11-07 MED ORDER — MORPHINE SULFATE (PF) 2 MG/ML IV SOLN
2.0000 mg | INTRAVENOUS | Status: DC | PRN
  Administered 2022-11-07 – 2022-11-08 (×3): 2 mg via INTRAVENOUS
  Filled 2022-11-07 (×4): qty 1

## 2022-11-07 MED ORDER — ACETAMINOPHEN 10 MG/ML IV SOLN: 1000.0000 mg | Freq: Once | INTRAVENOUS | Status: DC | PRN

## 2022-11-07 MED ORDER — MIDAZOLAM HCL 2 MG/2ML IJ SOLN
INTRAMUSCULAR | Status: AC
  Filled 2022-11-07: qty 2

## 2022-11-07 MED ORDER — VENLAFAXINE HCL ER 75 MG PO CP24
150.0000 mg | ORAL_CAPSULE | Freq: Every day | ORAL | Status: DC
  Filled 2022-11-07: qty 1
  Filled 2022-11-07: qty 2

## 2022-11-07 MED ORDER — PROPOFOL 10 MG/ML IV BOLUS
INTRAVENOUS | Status: AC
  Filled 2022-11-07: qty 40

## 2022-11-07 MED ORDER — ONDANSETRON HCL 4 MG/2ML IJ SOLN: 4.0000 mg | Freq: Four times a day (QID) | INTRAMUSCULAR | Status: DC | PRN

## 2022-11-07 MED ORDER — FENTANYL CITRATE (PF) 100 MCG/2ML IJ SOLN
INTRAMUSCULAR | Status: DC | PRN
  Administered 2022-11-07: 100 ug via INTRAVENOUS

## 2022-11-07 MED ORDER — ROCURONIUM BROMIDE 10 MG/ML (PF) SYRINGE
PREFILLED_SYRINGE | INTRAVENOUS | Status: AC
  Filled 2022-11-07: qty 10

## 2022-11-07 SURGICAL SUPPLY — 45 items
ADH SKN CLS APL DERMABOND .7 (GAUZE/BANDAGES/DRESSINGS)
APL PRP STRL LF DISP 70% ISPRP (MISCELLANEOUS) ×2
BLADE SURG 15 STRL LF DISP TIS (BLADE) ×2 IMPLANT
BLADE SURG 15 STRL SS (BLADE) ×2
CHLORAPREP W/TINT 26 (MISCELLANEOUS) ×2 IMPLANT
DERMABOND ADVANCED .7 DNX12 (GAUZE/BANDAGES/DRESSINGS) ×1 IMPLANT
DRAPE LAPAROTOMY 100X77 ABD (DRAPES) ×2 IMPLANT
ELECT BLADE 6.5 EXT (BLADE) ×2 IMPLANT
ELECT CAUTERY BLADE 6.4 (BLADE) ×2 IMPLANT
ELECT REM PT RETURN 9FT ADLT (ELECTROSURGICAL) ×2
ELECTRODE REM PT RTRN 9FT ADLT (ELECTROSURGICAL) ×2 IMPLANT
GAUZE 4X4 16PLY ~~LOC~~+RFID DBL (SPONGE) ×1 IMPLANT
GLOVE BIOGEL PI IND STRL 7.0 (GLOVE) ×2 IMPLANT
GLOVE SURG SYN 6.5 ES PF (GLOVE) ×2 IMPLANT
GLOVE SURG SYN 6.5 PF PI (GLOVE) ×1 IMPLANT
GOWN STRL REUS W/ TWL LRG LVL3 (GOWN DISPOSABLE) ×4 IMPLANT
GOWN STRL REUS W/TWL LRG LVL3 (GOWN DISPOSABLE) ×4
HOLDER FOLEY CATH W/STRAP (MISCELLANEOUS) ×1 IMPLANT
KIT PREVENA INCISION MGT20CM45 (CANNISTER) ×1 IMPLANT
KIT TURNOVER KIT A (KITS) ×2 IMPLANT
LABEL OR SOLS (LABEL) ×2 IMPLANT
LIGASURE IMPACT 36 18CM CVD LR (INSTRUMENTS) ×1 IMPLANT
MANIFOLD NEPTUNE II (INSTRUMENTS) ×2 IMPLANT
MESH VENTRIO PATCH MED OVAL (Mesh General) ×1 IMPLANT
NDL HYPO 22X1.5 SAFETY MO (MISCELLANEOUS) ×2 IMPLANT
NEEDLE HYPO 22X1.5 SAFETY MO (MISCELLANEOUS) ×4 IMPLANT
NS IRRIG 500ML POUR BTL (IV SOLUTION) ×2 IMPLANT
PACK BASIN MINOR ARMC (MISCELLANEOUS) ×2 IMPLANT
PENCIL SMOKE EVACUATOR (MISCELLANEOUS) ×1 IMPLANT
SPONGE T-LAP 18X18 ~~LOC~~+RFID (SPONGE) ×1 IMPLANT
SUT ETHIBOND 0 MO6 C/R (SUTURE) ×3 IMPLANT
SUT ETHIBOND NAB MO 7 #0 18IN (SUTURE) ×2 IMPLANT
SUT MNCRL 4-0 (SUTURE) ×2
SUT MNCRL 4-0 27XMFL (SUTURE) ×2
SUT PDS AB 1 TP1 54 (SUTURE) ×2 IMPLANT
SUT SILK 2 0 (SUTURE) ×2
SUT SILK 2-0 18XBRD TIE 12 (SUTURE) ×1 IMPLANT
SUT VIC AB 3-0 SH 27 (SUTURE) ×12
SUT VIC AB 3-0 SH 27X BRD (SUTURE) ×7 IMPLANT
SUTURE MNCRL 4-0 27XMF (SUTURE) ×2 IMPLANT
SYR 10ML LL (SYRINGE) ×4 IMPLANT
SYR 20ML LL LF (SYRINGE) ×2 IMPLANT
TRAP FLUID SMOKE EVACUATOR (MISCELLANEOUS) ×2 IMPLANT
TRAY FOLEY MTR SLVR 16FR STAT (SET/KITS/TRAYS/PACK) ×1 IMPLANT
WATER STERILE IRR 500ML POUR (IV SOLUTION) ×2 IMPLANT

## 2022-11-07 NOTE — TOC CM/SW Note (Signed)
Transition of Care [facility]) - Inpatient Brief Assessment   Patient Details  Name: Destiny Anderson MRN: [id number] Date of Birth: 11/23/1970  Transition of Care [facility]) CM/SW Contact:    Kemper Durie, RN Phone Number: 11/07/2022, 12:37 PM   Clinical Narrative:  Brief assessment done, no TOC needs identified at this time.   Transition of Care Asessment: Insurance and Status: Insurance coverage has been reviewed Patient has primary care physician: Yes Home environment has been reviewed: Yes Prior level of function:: Independent Prior/Current Home Services: No current home services Social Determinants of Health Reivew: SDOH reviewed no interventions necessary Readmission risk has been reviewed: Yes Transition of care needs: no transition of care needs at this time

## 2022-11-07 NOTE — Anesthesia Preprocedure Evaluation (Addendum)
Anesthesia Evaluation  Patient identified by MRN, date of birth, ID band Patient awake    Reviewed: Allergy & Precautions, NPO status , Patient's Chart, lab work & pertinent test results  History of Anesthesia Complications Negative for: history of anesthetic complications  Airway Mallampati: IV   Neck ROM: Full    Dental  (+) Missing, Chipped   Pulmonary former smoker (quit 6 years ago; current vaping)   Pulmonary exam normal breath sounds clear to auscultation       Cardiovascular hypertension, Normal cardiovascular exam Rhythm:Regular Rate:Normal  ECG 11/06/22: Sinus tachycardia (HR 138) Otherwise normal ECG When compared with ECG of 09-Jul-2020 00:07, No significant change was found   Neuro/Psych  PSYCHIATRIC DISORDERS  Depression    negative neurological ROS     GI/Hepatic ,GERD  ,,  Endo/Other  diabetes, Type 2  Class 3 obesity  Renal/GU negative Renal ROS     Musculoskeletal   Abdominal   Peds  Hematology negative hematology ROS (+)   Anesthesia Other Findings   Reproductive/Obstetrics                             Anesthesia Physical Anesthesia Plan  ASA: 3 and emergent  Anesthesia Plan: General   Post-op Pain Management:    Induction: Intravenous  PONV Risk Score and Plan: 3 and Ondansetron, Dexamethasone and Treatment may vary due to age or medical condition  Airway Management Planned: Oral ETT  Additional Equipment:   Intra-op Plan:   Post-operative Plan: Extubation in OR  Informed Consent: I have reviewed the patients History and Physical, chart, labs and discussed the procedure including the risks, benefits and alternatives for the proposed anesthesia with the patient or authorized representative who has indicated his/her understanding and acceptance.     Dental advisory given  Plan Discussed with: CRNA  Anesthesia Plan Comments: (Patient consented for  risks of anesthesia including but not limited to:  - adverse reactions to medications - damage to eyes, teeth, lips or other oral mucosa - nerve damage due to positioning  - sore throat or hoarseness - damage to heart, brain, nerves, lungs, other parts of body or loss of life  Informed patient about role of CRNA in peri- and intra-operative care.  Patient voiced understanding.)        Anesthesia Quick Evaluation

## 2022-11-07 NOTE — Consult Note (Signed)
Initial Consultation Note   Patient: Destiny Anderson WUJ:[id number] DOB: 06-17-1970 PCP: Sandrea Hughs, NP DOA: 11/06/2022 DOS: the patient was seen and examined on 11/07/2022 Primary service: Sung Amabile, DO  Referring physician: Dr Tonna Boehringer Reason for consult: sinus tachycardia   Assessment/Plan: Assessment and Plan: * Ventral hernia with obstruction and without gangrene S/p ventral hernia repair on 11/07/22 Continued management per surgery  Sinus tachycardia Uncertain etiology but appears to have baseline tachycardia on review of past records Suspecting in part relative dehydration, possibly physiologic and related to acute illness Will get baseline labs to include TSH, troponin and BNP Portable CXR to evaluate for vascular congestion Will place on telemetry Will trial additional metoprolol IV while workup being done Workup:--> Mostly unremarkable Troponin 7 and BNP 10 potassium 3.4, BUN/creatinine 21/0.8, TSH 1.879(dimer elevated at 5 but likely related to surgery and patient denies CP and SOB or leg pain) Chest x-ray with no active disease, normal heart size and normal pulmonary vascularity Suspect relative dehydration, given prior vomiting and now having loose stool following ventral hernia repair, in combination with diuretics -Will give an additional bolus - Continue maintenance fluids -If continued tachycardia will trial a small dose of metoprolol as heart rate continues to be in the 120s    Uncontrolled type 2 diabetes mellitus with hyperglycemia, with long-term current use of insulin (HCC) Continue basal insulin with sliding scale coverage  Hypertension Lisinopril. Hold lasix and hydrochlorothiazide for now as dehydration is a consideration  Morbid obesity with body mass index of 60 or higher (HCC) Complicating factor to overall prognosis and care  Depression Continue venlafaxine, abilify and trazodone       TRH will continue to follow the patient.  HPI:  Destiny Anderson is a 52 y.o. female with past medical history of 52 year old female with a history of diabetes, hypertension and morbid obesity, currently admitted to the surgical service and is s/p incarcerated ventral hernia repair on 11/07/2022 who is being seen in consultation for management of medical problems, specifically, persistent tachycardia in spite of resumption of metoprolol postoperatively as well as uncontrolled blood sugars.  Per Dr. Tonna Boehringer, patient is currently pain controlled and resumed her metoprolol 8 hours prior but continues to have heart rates in the 120s.  Patient denies chest pain or shortness of breath or leg pain.  She initially presented to the ED with nausea vomiting and abdominal pain.  The symptoms have since resolved with her surgical procedure.  She had 2 watery BMs since her surgery. of note, patient has been previously evaluated for shortness of breath in the ED in June 2022 and was also in sinus tachycardia at the time with a rate of 106.  She was anemic at that time with hemoglobin 8.8 and chest x-ray had shown central vascular congestion without overt edema.  She did not have a follow-up cardiac workup.  She is chronically on Lasix for lower extremity swelling. Current vitals personally reviewed: Afebrile, pulse 127 (122 - 127) BP 127/74 and O2 sat 97% on 2 L EKG from admission reviewed and showed sinus tachycardia at 138 Labs: In the a.m of 9/29, blood sugar was 420 with normal gap and bicarb 23.Creatinine normal.  Hemoglobin 12.9.  WBC 11.5. Postoperatively, blood sugar has trended down and is currently 203 as of last check a couple hours prior to consultation. Patient did have a normal UA on arrival to the ED on 9/28 Chest x-ray: Lower lungs on CT abdomen and pelvis showed no acute abnormality Review  of medication: Patient noted to be on Lasix 40 mg daily, hydrochlorothiazide 12.5 mg daily, Toprol 25 mg daily among others.  Review of Systems: As mentioned in the  history of present illness. All other systems reviewed and are negative. Past Medical History:  Diagnosis Date   Depression    Diabetes mellitus without complication (HCC)    Hypercholesteremia    Hypertension    Past Surgical History:  Procedure Laterality Date   CESAREAN SECTION     x4   CHOLECYSTECTOMY     HERNIA REPAIR     INSERTION OF MESH N/A 11/07/2022   Procedure: INSERTION OF MESH;  Surgeon: Sung Amabile, DO;  Location: [facility] ORS;  Service: General;  Laterality: N/A;   VENTRAL HERNIA REPAIR N/A 11/07/2022   Procedure: HERNIA REPAIR VENTRAL ADULT;  Surgeon: Sung Amabile, DO;  Location: [facility] ORS;  Service: General;  Laterality: N/A;   Social History:  reports that she has quit smoking. She has never used smokeless tobacco. She reports that she does not drink alcohol and does not use drugs.  No Known Allergies  History reviewed. No pertinent family history.  Prior to Admission medications   Medication Sig Start Date End Date Taking? Authorizing Provider  ARIPiprazole (ABILIFY) 5 MG tablet Take 5 mg by mouth daily.   Yes [provider]  brexpiprazole (REXULTI) 2 MG TABS tablet Take 2 mg by mouth daily.   Yes [provider]  desvenlafaxine (PRISTIQ) 100 MG 24 hr tablet Take 100 mg by mouth daily.   Yes [provider]  furosemide (LASIX) 40 MG tablet Take 40 mg by mouth daily.   Yes [provider]  gabapentin (NEURONTIN) 300 MG capsule Take 300 mg by mouth 2 (two) times daily.   Yes [provider]  lisinopril-hydrochlorothiazide (PRINZIDE,ZESTORETIC) 20-12.5 MG tablet Take 2 tablets by mouth daily.   Yes [provider]  metoprolol succinate (TOPROL-XL) 25 MG 24 hr tablet Take 25 mg by mouth daily.   Yes [provider]  pravastatin (PRAVACHOL) 40 MG tablet Take 40 mg by mouth daily.   Yes [provider]  sitaGLIPtin-metformin (JANUMET) 50-1000 MG tablet Take 1 tablet by mouth 2 (two) times daily with a  meal.   Yes [provider]  traZODone (DESYREL) 50 MG tablet Take 50-100 mg by mouth at bedtime.   Yes [provider]  ferrous sulfate 325 (65 FE) MG EC tablet Take 1 tablet (325 mg total) by mouth daily with breakfast. 07/09/20   Phineas Semen, MD  insulin aspart (NOVOLOG) 100 UNIT/ML injection Inject 15 Units into the skin 2 (two) times daily.    [provider]  insulin detemir (LEVEMIR) 100 UNIT/ML injection Inject 53 Units into the skin 2 (two) times daily.    [provider]    Physical Exam: Vitals:   11/07/22 1347 11/07/22 1638 11/07/22 1915 11/07/22 1948  BP: 125/68 133/87 (!) 104/46 127/74  Pulse: (!) 120 (!) 125 (!) 125 (!) 127  Resp: 20 19 20 20   Temp: 97.7 F (36.5 C) 98.2 F (36.8 C) 98.1 F (36.7 C) 98 F (36.7 C)  TempSrc: Oral Oral Oral   SpO2: 96% 96% 99% 97%  Weight:      Height:       Physical Exam Vitals and nursing note reviewed.  Constitutional:      General: She is not in acute distress. HENT:     Head: Normocephalic and atraumatic.  Cardiovascular:     Rate and  Rhythm: Normal rate and regular rhythm.     Heart sounds: Normal heart sounds.  Pulmonary:     Effort: Pulmonary effort is normal.     Breath sounds: Normal breath sounds.  Abdominal:     Palpations: Abdomen is soft.     Tenderness: There is no abdominal tenderness.  Neurological:     Mental Status: Mental status is at baseline.     Data Reviewed: Relevant notes from surgery and ED and  including Care Everywhere. Prior diagnostic testing as pertinent to current admission diagnoses Updated medications and problem lists for reconciliation ED course, including vitals, labs, imaging, treatment and response to treatment Triage notes, nursing and pharmacy notes and ED provider's notes Notable results as noted in HPI   Family Communication: daughter and husband at bedside Primary team communication: Dr Tonna Boehringer Thank you very much for involving [place] in  the care of your patient.  Author: Andris Baumann, MD 11/07/2022 8:34 PM  For on call review [email or web address].

## 2022-11-07 NOTE — H&P (View-Only) (Signed)
Notified by RN about increase in pain again a little while ago when pt ambulated to use restroom.  Pain uncontrolled with meds now.  Exam noted audible bowel sounds again in area that was examined previously, more sensation of fullness again, not able to be reduced.  Due to recurrent nature and increasing pain now, will emergently take to OR for possible strangulated ventral hernia.  Open approach due to weight and previous repair laparoscopically.  Discussed the risk of surgery including recurrence, which can be up to 50% in the case of incisional or complex hernias, possible use of prosthetic materials (mesh) and the increased risk of mesh infxn if used, bleeding, chronic pain, post-op infxn, post-op SBO or ileus, and possible re-operation to address said risks. The risks of general anesthetic, if used, includes MI, CVA, sudden death or even reaction to anesthetic medications also discussed. Alternatives include continued observation.  Benefits include possible symptom relief, prevention of incarceration, strangulation, enlargement in size over time, and the risk of emergency surgery in the face of strangulation.  Typical post-op recovery time of 3-5 days with 4-6 weeks of activity restrictions were also discussed.  The patient verbalized understanding and all questions were answered to the patient's satisfaction.   She understands due to her weight and DM, last A1c in mid 9s per verbal report, she is at extremely high risk of perioperative complications as well as early recurrence.

## 2022-11-07 NOTE — ED Notes (Signed)
Patient still complaints of severe abdominal pain worsening to 10/10 after returning to ER stretcher from commode despite use of IV and oral narcotics. Surgery paged to notify.

## 2022-11-07 NOTE — OR Nursing (Signed)
Spoke with Dagmar Hait via cell [phone] updated given on patient status in OR at this time.

## 2022-11-07 NOTE — ED Notes (Signed)
Patient placed on 3L [place] due to desatting to mid 80s while sleeping.

## 2022-11-07 NOTE — Assessment & Plan Note (Signed)
S/p ventral hernia repair on 11/07/22 Continued management per surgery

## 2022-11-07 NOTE — Anesthesia Postprocedure Evaluation (Signed)
Anesthesia Post Note  Patient: SHARNELL KNIGHT  Procedure(s) Performed: HERNIA REPAIR VENTRAL ADULT INSERTION OF MESH (Abdomen)  Patient location during evaluation: PACU Anesthesia Type: General Level of consciousness: awake and alert, oriented and patient cooperative Pain management: pain level controlled Vital Signs Assessment: post-procedure vital signs reviewed and stable Respiratory status: spontaneous breathing, nonlabored ventilation and respiratory function stable Cardiovascular status: blood pressure returned to baseline and stable Postop Assessment: adequate PO intake Anesthetic complications: no   No notable events documented.   Last Vitals:  Vitals:   11/07/22 1145 11/07/22 1155  BP: 129/83 119/62  Pulse: (!) 127 (!) 125  Resp: 17 17  Temp:  37 C  SpO2: 95% 96%    Last Pain:  Vitals:   11/07/22 1155  TempSrc:   PainSc: 0-No pain                 Reed Breech

## 2022-11-07 NOTE — Assessment & Plan Note (Signed)
Continue basal insulin with sliding scale coverage

## 2022-11-07 NOTE — Interval H&P Note (Signed)
History and Physical Interval Note:  11/07/2022 7:00 AM  Destiny Anderson  has presented today for surgery, with the diagnosis of incarcerated possible strangulated hernia.  The various methods of treatment have been discussed with the patient and family. After consideration of risks, benefits and other options for treatment, the patient has consented to  Procedure(s): HERNIA REPAIR VENTRAL ADULT, POSSIBLE SMALL BOWEL RESECTION (N/A) as a surgical intervention.  The patient's history has been reviewed, patient examined, no change in status, stable for surgery.  I have reviewed the patient's chart and labs.  Questions were answered to the patient's satisfaction.     Asaad Gulley Tonna Boehringer

## 2022-11-07 NOTE — ED Notes (Signed)
Tonna Boehringer, MD with surgery team to bedside to evaluate patient and discuss surgery risks/benefits with stated understanding. Consents signed. Patient placed into gown for pre op.

## 2022-11-07 NOTE — Plan of Care (Signed)
  Problem: Education: Goal: Ability to describe self-care measures that may prevent or decrease complications (Diabetes Survival Skills Education) will improve Outcome: Progressing Goal: Individualized Educational Video(s) Outcome: Progressing   Problem: Coping: Goal: Ability to adjust to condition or change in health will improve Outcome: Progressing   

## 2022-11-07 NOTE — ED Notes (Signed)
Report given to [place] in [place]. Patient to be taken to PACU by this RN.

## 2022-11-07 NOTE — Assessment & Plan Note (Signed)
Lisinopril. Hold lasix and hydrochlorothiazide for now as dehydration is a consideration

## 2022-11-07 NOTE — Assessment & Plan Note (Signed)
Continue venlafaxine, abilify and trazodone

## 2022-11-07 NOTE — Anesthesia Procedure Notes (Signed)
Procedure Name: Intubation Date/Time: 11/07/2022 7:38 AM  Performed by: Mathews Argyle, CRNAPre-anesthesia Checklist: Patient identified, Patient being monitored, Timeout performed, Emergency Drugs available and Suction available Patient Re-evaluated:Patient Re-evaluated prior to induction Oxygen Delivery Method: Circle system utilized Preoxygenation: Pre-oxygenation with 100% oxygen Induction Type: IV induction and Rapid sequence Laryngoscope Size: 3 and McGraph Grade View: Grade I Tube type: Oral Tube size: 7.0 mm Number of attempts: 1 Airway Equipment and Method: Stylet, Video-laryngoscopy and Patient positioned with wedge pillow Placement Confirmation: ETT inserted through vocal cords under direct vision, positive ETCO2 and breath sounds checked- equal and bilateral Secured at: 20 cm Tube secured with: Tape Dental Injury: Teeth and Oropharynx as per pre-operative assessment

## 2022-11-07 NOTE — Op Note (Signed)
Preoperative diagnosis: Ventral hernia, recurrent, incarcerated  Postoperative diagnosis: same  Procedure:  Open ventral hernia repair with mesh  Anesthesia: LMA  Surgeon: Sung Amabile  Wound Classification: Clean  Specimen: none  Complications: None  Estimated Blood Loss: 50 mL  Indications:see HPI  Findings: 5.3 cm x 5.7 cm ventral, recurrent, incarcerated hernia containing omentum 4. Tension free repair achieved with mesh and suture 5. Adequate hemostasis  Description of procedure: The patient was brought to the operating room and general anesthesia was induced. A time-out was completed verifying correct patient, procedure, site, positioning, and implant(s) and/or special equipment prior to beginning this procedure. Antibiotics were administered prior to making the incision. SCDs placed. The anterior abdominal wall was prepped and draped in the standard sterile fashion.  Foley placed preop in anticipation for a possible prolonged procedure.  Based on CT scan, a paraumbilical incision slightly left of midline made. Dissection carried down through very thick subcutaneous fat until the hernia sac was noted.  Additional extensive dissection was carried out to isolate the hernia sac completely and down to fascia.  Silk sutures were used to suture ligate several vessels within the subcutaneous fat.  Portion of the hernia sac was pulling towards the feet, so additional dissection carried out to completely isolate the sac.    Large hernia sac then opened and swollen but viable omental contents noted.  Attachments to the hernia sac and omentum was transected using LigaSure and the contents were eventually reduced back into the abdominal cavity.  No evidence of bowel was noted within the hernia sac at time of dissection.  Slightly dilated loops of small bowel was noted deep to the omentum within the abdominal cavity.  Excess hernia sac then transected and passed off operative field again using  LigaSure to minimize bleeding issues.  5.3 cm x 5.7 cm hernia defect was noted with the medial aspect adjacent to the previously noted mesh.  The old mesh was completely integrated into the tissue and explantation was not possible.  Decision was made to proceed with a inlay mesh placement.  A 11 cm x 14 cm covered mesh was placed within the preperitoneal cavity through the present defect and secured in place on the abdominal wall using interrupted 0 Ethibond sutures circumferentially.  Care was noted to ensure that the mesh laid flat and no omental contents were on top of the mesh.  In order to facilitate proper placement of the mesh. Once the mesh was noted to be laying flat and secured to the abdominal wall the defect itself was primary closed using 0 Ethibond in a interrupted fashion.  The fascia as well as the skin incision was then infused with Exparel.  Wound was irrigated and closed in a multilayer fashion, using 3-0 Vicryl for the thick subcutaneous fat layer to minimize potential space for seroma formation.  Skin then closed with staples, and Prevena wound VAC placed over the staple line with good seal.  Patient was then successfully awakened and transferred to PACU in stable condition.  At the end of the procedure sponge and instrument counts were correct

## 2022-11-07 NOTE — Assessment & Plan Note (Signed)
Complicating factor to overall prognosis and care 

## 2022-11-07 NOTE — Assessment & Plan Note (Addendum)
Uncertain etiology but appears to have baseline tachycardia on review of past records Suspecting in part relative dehydration, possibly physiologic and related to acute illness Will get baseline labs to include TSH, troponin and BNP Portable CXR to evaluate for vascular congestion Will place on telemetry Will trial additional metoprolol IV while workup being done Workup:--> Mostly unremarkable Troponin 7 and BNP 10 potassium 3.4, BUN/creatinine 21/0.8, TSH 1.879(dimer elevated at 5 but likely related to surgery and patient denies CP and SOB or leg pain) Repeat EKG with sinus tach at 125 Chest x-ray with no active disease, normal heart size and normal pulmonary vascularity Suspect relative dehydration, given prior vomiting and now having loose stool following ventral hernia repair, in combination with diuretics -Will give an additional bolus - Continue maintenance fluids -Stool studies

## 2022-11-07 NOTE — Transfer of Care (Signed)
Immediate Anesthesia Transfer of Care Note  Patient: Destiny Anderson  Procedure(s) Performed: HERNIA REPAIR VENTRAL ADULT INSERTION OF MESH (Abdomen)  Patient Location: PACU  Anesthesia Type:General  Level of Consciousness: drowsy  Airway & Oxygen Therapy: Patient Spontanous Breathing and Patient connected to face mask oxygen  Post-op Assessment: Report given to RN and Post -op Vital signs reviewed and stable  Post vital signs: Reviewed  Last Vitals:  Vitals Value Taken Time  BP 128/72 11/07/22 1106  Temp    Pulse 111 11/07/22 1108  Resp 15 11/07/22 1108  SpO2 96 % 11/07/22 1108  Vitals shown include unfiled device data.  Last Pain:         Complications: No notable events documented.

## 2022-11-07 NOTE — ED Notes (Signed)
Morning labs drawn. Meds given. Patient back to resting quietly with eyes closed, VSS, CCM in use, call light within reach.

## 2022-11-07 NOTE — Progress Notes (Signed)
MEWS Progress Note  Patient Details Name: Destiny Anderson MRN: [id number] DOB: 1970-04-15 Today's Date: 11/07/2022   MEWS Flowsheet Documentation:  Assess: MEWS Score Temp: 98 F (36.7 C) BP: 127/74 MAP (mmHg): 85 Pulse Rate: (!) 127 ECG Heart Rate: (!) 123 Resp: 20 Level of Consciousness: Alert SpO2: 97 % O2 Device: Nasal Cannula O2 Flow Rate (L/min): 2 L/min Assess: MEWS Score MEWS Temp: 0 MEWS Systolic: 0 MEWS Pulse: 2 MEWS RR: 0 MEWS LOC: 0 MEWS Score: 2 MEWS Score Color: Yellow Assess: SIRS CRITERIA SIRS Temperature : 0 SIRS Respirations : 0 SIRS Pulse: 1 SIRS WBC: 0 SIRS Score Sum : 1 SIRS Temperature : 0 SIRS Pulse: 1 SIRS Respirations : 0 SIRS WBC: 0 SIRS Score Sum : 1 Assess: if the MEWS score is Yellow or Red Were vital signs accurate and taken at a resting state?: Yes Does the patient meet 2 or more of the SIRS criteria?: No MEWS guidelines implemented : Yes, yellow Treat MEWS Interventions: Considered administering scheduled or prn medications/treatments as ordered Take Vital Signs Increase Vital Sign Frequency : Yellow: Q2hr x1, continue Q4hrs until patient remains green for 12hrs Escalate MEWS: Escalate: Yellow: Discuss with charge nurse and consider notifying provider and/or RRT        Maralyn Sago 11/07/2022, 8:17 PM

## 2022-11-07 NOTE — Progress Notes (Signed)
Notified by RN about increase in pain again a little while ago when pt ambulated to use restroom.  Pain uncontrolled with meds now.  Exam noted audible bowel sounds again in area that was examined previously, more sensation of fullness again, not able to be reduced.  Due to recurrent nature and increasing pain now, will emergently take to OR for possible strangulated ventral hernia.  Open approach due to weight and previous repair laparoscopically.  Discussed the risk of surgery including recurrence, which can be up to 50% in the case of incisional or complex hernias, possible use of prosthetic materials (mesh) and the increased risk of mesh infxn if used, bleeding, chronic pain, post-op infxn, post-op SBO or ileus, and possible re-operation to address said risks. The risks of general anesthetic, if used, includes MI, CVA, sudden death or even reaction to anesthetic medications also discussed. Alternatives include continued observation.  Benefits include possible symptom relief, prevention of incarceration, strangulation, enlargement in size over time, and the risk of emergency surgery in the face of strangulation.  Typical post-op recovery time of 3-5 days with 4-6 weeks of activity restrictions were also discussed.  The patient verbalized understanding and all questions were answered to the patient's satisfaction.   She understands due to her weight and DM, last A1c in mid 9s per verbal report, she is at extremely high risk of perioperative complications as well as early recurrence.

## 2022-11-08 ENCOUNTER — Other Ambulatory Visit (HOSPITAL_COMMUNITY): Payer: Self-pay

## 2022-11-08 DIAGNOSIS — A0811 Acute gastroenteropathy due to Norwalk agent: Secondary | ICD-10-CM

## 2022-11-08 DIAGNOSIS — K436 Other and unspecified ventral hernia with obstruction, without gangrene: Secondary | ICD-10-CM | POA: Diagnosis not present

## 2022-11-08 LAB — GASTROINTESTINAL PANEL BY PCR, STOOL (REPLACES STOOL CULTURE)

## 2022-11-08 LAB — LACTIC ACID, PLASMA
Lactic Acid, Venous: 0.9 mmol/L (ref 0.5–1.9)
Lactic Acid, Venous: 1.9 mmol/L (ref 0.5–1.9)

## 2022-11-08 LAB — BASIC METABOLIC PANEL
Anion gap: 9 (ref 5–15)
BUN: 17 mg/dL (ref 6–20)
CO2: 25 mmol/L (ref 22–32)
Calcium: 7.3 mg/dL — ABNORMAL LOW (ref 8.9–10.3)
Chloride: 102 mmol/L (ref 98–111)
Creatinine, Ser: 0.75 mg/dL (ref 0.44–1.00)
GFR, Estimated: >60 mL/min (ref >60)
Glucose, Bld: 227 mg/dL — ABNORMAL HIGH (ref 70–99)
Potassium: 3.5 mmol/L (ref 3.5–5.1)
Sodium: 136 mmol/L (ref 135–145)

## 2022-11-08 LAB — CBC
HCT: 34.1 % — ABNORMAL LOW (ref 36.0–46.0)
Hemoglobin: 10.7 g/dL — ABNORMAL LOW (ref 12.0–15.0)
MCH: 25.9 pg — ABNORMAL LOW (ref 26.0–34.0)
MCHC: 31.4 g/dL (ref 30.0–36.0)
MCV: 82.6 fL (ref 80.0–100.0)
Platelets: 314 10*3/uL (ref 150–400)
RBC: 4.13 MIL/uL (ref 3.87–5.11)
RDW: 15.5 % (ref 11.5–15.5)
WBC: 6.9 10*3/uL (ref 4.0–10.5)
nRBC: 0 % (ref 0.0–0.2)

## 2022-11-08 LAB — GLUCOSE, CAPILLARY
Glucose-Capillary: 398 mg/dL — ABNORMAL HIGH (ref 70–99)
Glucose-Capillary: 230 mg/dL — ABNORMAL HIGH (ref 70–99)
Glucose-Capillary: 273 mg/dL — ABNORMAL HIGH (ref 70–99)
Glucose-Capillary: 251 mg/dL — ABNORMAL HIGH (ref 70–99)
Glucose-Capillary: 233 mg/dL — ABNORMAL HIGH (ref 70–99)

## 2022-11-08 LAB — TROPONIN I (HIGH SENSITIVITY): Troponin I (High Sensitivity): 9 ng/L (ref <18)

## 2022-11-08 LAB — C DIFFICILE QUICK SCREEN W PCR REFLEX
C Diff antigen: POSITIVE — AB
C Diff toxin: NEGATIVE

## 2022-11-08 LAB — CLOSTRIDIUM DIFFICILE BY PCR, REFLEXED: Toxigenic C. Difficile by PCR: POSITIVE — AB

## 2022-11-08 LAB — PROCALCITONIN: Procalcitonin: 0.16 ng/mL

## 2022-11-08 MED ORDER — CHLORHEXIDINE GLUCONATE CLOTH 2 % EX PADS
6.0000 | MEDICATED_PAD | Freq: Every day | CUTANEOUS | Status: DC
  Administered 2022-11-08: 6 via TOPICAL

## 2022-11-08 MED ORDER — MORPHINE SULFATE (PF) 2 MG/ML IV SOLN: 2.0000 mg | INTRAVENOUS | Status: DC | PRN

## 2022-11-08 MED ORDER — TRAMADOL HCL 50 MG PO TABS: 50.0000 mg | ORAL_TABLET | Freq: Four times a day (QID) | ORAL | Status: DC | PRN

## 2022-11-08 MED ORDER — INSULIN DETEMIR 100 UNIT/ML ~~LOC~~ SOLN
15.0000 [IU] | Freq: Two times a day (BID) | SUBCUTANEOUS | Status: DC
  Administered 2022-11-08 – 2022-11-09 (×3): 15 [IU] via SUBCUTANEOUS
  Filled 2022-11-08 (×4): qty 0.15

## 2022-11-08 MED ORDER — ACETAMINOPHEN 325 MG PO TABS
650.0000 mg | ORAL_TABLET | Freq: Four times a day (QID) | ORAL | Status: DC | PRN
  Administered 2022-11-08: 650 mg via ORAL
  Filled 2022-11-08: qty 2

## 2022-11-08 MED ORDER — SODIUM CHLORIDE 0.9 % IV BOLUS
500.0000 mL | Freq: Once | INTRAVENOUS | Status: AC
  Administered 2022-11-08: 500 mL via INTRAVENOUS

## 2022-11-08 MED ORDER — VANCOMYCIN HCL 125 MG PO CAPS
125.0000 mg | ORAL_CAPSULE | Freq: Four times a day (QID) | ORAL | Status: DC
  Administered 2022-11-08 – 2022-11-10 (×10): 125 mg via ORAL
  Filled 2022-11-08 (×12): qty 1

## 2022-11-08 MED ORDER — OXYCODONE HCL 5 MG PO TABS
5.0000 mg | ORAL_TABLET | Freq: Four times a day (QID) | ORAL | Status: DC | PRN
  Administered 2022-11-09: 5 mg via ORAL
  Filled 2022-11-08: qty 1

## 2022-11-08 MED ORDER — DESVENLAFAXINE SUCCINATE ER 100 MG PO TB24: 100.0000 mg | ORAL_TABLET | Freq: Every day | ORAL | Status: DC

## 2022-11-08 NOTE — Assessment & Plan Note (Deleted)
  During the course of workup, patient continued to have diarrhea Stool for C. difficile and GI panel ordered Additional fluid bolus given Updated vitals showed low-grade temp of 99.7, BP 106/52, heart rate improving with repeat fluid bolus Will add lactic acid and increase maintenance fluids pending result

## 2022-11-08 NOTE — TOC Benefit Eligibility Note (Signed)
Patient Product/process development scientist completed.    The patient is insured through [facility] MEDICAID.     Ran test claim for vancomycin 125 mg capsule and the current 10 day co-pay is $4.00.   This test claim was processed through [facility]- copay amounts may vary at other pharmacies due to pharmacy/plan contracts, or as the patient moves through the different stages of their insurance plan.     Roland Earl, CPHT Pharmacy Technician III Certified Patient Advocate [facility] Patient Advocate Team Direct Number: [phone]  Fax: [phone]

## 2022-11-08 NOTE — Progress Notes (Signed)
Subjective:  CC: Destiny Anderson is a 52 y.o. female  Hospital stay day 1, 1 Day Post-Op open ventral hernia repair with mesh for incarcerated ventral hernia  HPI: Patient reports change in pain pattern today compared to preop.  She reports multiple episodes of diarrhea.  Hospitalist consulted overnight for persistent tachycardia.  ROS:  General: Denies weight loss, weight gain, fatigue, fevers, chills, and night sweats. Heart: Denies chest pain, palpitations, racing heart, irregular heartbeat, leg pain or swelling, and decreased activity tolerance. Respiratory: Denies breathing difficulty, shortness of breath, wheezing, cough, and sputum. GI: Denies change in appetite, heartburn, nausea, vomiting, constipation, diarrhea, and blood in stool. GU: Denies difficulty urinating, pain with urinating, urgency, frequency, blood in urine.   Objective:   Temp:  [97.7 F (36.5 C)-99.7 F (37.6 C)] 98 F (36.7 C) (09/30 0742) Pulse Rate:  [111-129] 118 (09/30 0742) Resp:  [15-22] 19 (09/30 0742) BP: (104-142)/(46-87) 142/66 (09/30 0742) SpO2:  [92 %-100 %] 100 % (09/30 0742)     Height: 5\' 3"  (160 cm) Weight: (!) 155 kg BMI (Calculated): 60.55   Intake/Output this shift:   Intake/Output Summary (Last 24 hours) at 11/08/2022 0744 Last data filed at 11/08/2022 0327 Gross per 24 hour  Intake 3242.5 ml  Output 750 ml  Net 2492.5 ml    Constitutional :  alert, cooperative, appears stated age, and no distress  Respiratory:  clear to auscultation bilaterally  Cardiovascular:  Tachycardia rate  Gastrointestinal: Soft, no guarding, Prevena wound VAC at midline incision site intact chronic skin discoloration around the incision site stable .   Skin: Cool and moist.   Psychiatric: Normal affect, non-agitated, not confused       LABS:     Latest Ref Rng & Units 11/08/2022    3:44 AM 11/07/2022    9:11 PM 11/07/2022    5:13 AM  CMP  Glucose 70 - 99 mg/dL 161  096  045   BUN 6 - 20 mg/dL 17   21  19    Creatinine 0.44 - 1.00 mg/dL 4.09  8.11  9.14   Sodium 135 - 145 mmol/L 136  137  134   Potassium 3.5 - 5.1 mmol/L 3.5  3.4  4.6   Chloride 98 - 111 mmol/L 102  100  98   CO2 22 - 32 mmol/L 25  26  23    Calcium 8.9 - 10.3 mg/dL 7.3  7.9  8.8   Total Protein 6.5 - 8.1 g/dL   7.5   Total Bilirubin 0.3 - 1.2 mg/dL   0.7   Alkaline Phos 38 - 126 U/L   100   AST 15 - 41 U/L   40   ALT 0 - 44 U/L   43       Latest Ref Rng & Units 11/08/2022    3:44 AM 11/07/2022    9:53 PM 11/07/2022    4:14 AM  CBC  WBC 4.0 - 10.5 K/uL 6.9  6.2  10.1   Hemoglobin 12.0 - 15.0 g/dL 78.2  95.6  21.3   Hematocrit 36.0 - 46.0 % 34.1  36.2  41.4   Platelets 150 - 400 K/uL 314  342  364     RADS: N/a Assessment:   S/p. open ventral hernia repair with mesh for incarcerated ventral hernia.  Overall as expected this a.m. from surgical standpoint.  Will advance to clear liquid diet and continue to monitor hemoglobin, which is likely due to dilution.  Pain control.  Further care regarding persistent tachycardia and norovirus and c.diff monitoring per hospitalist.    Recommend withholding DVT Lovenox prophylaxis for now until hemoglobin stabilizes.  Also recommend bed rest for 1 more day prior to mobilization due to her extremely high BMI and high risk of hernia recurrence.  labs/images/medications/previous chart entries reviewed personally and relevant changes/updates noted above.

## 2022-11-08 NOTE — Assessment & Plan Note (Addendum)
Possible C diff diarrhea Possible sepsis During the course of workup, patient continued to have diarrhea Stool for C. difficile and GI panel ordered----> Norovirus and C diff antigenpositive Additional fluid bolus given Updated vitals showed low-grade temp of 99.7, BP 106/52, heart rate improving with repeat fluid bolus Will add lactic acid and increase maintenance fluids pending result Oral vancomycin 125mg  qid Enteric precautions Supportive care

## 2022-11-08 NOTE — Progress Notes (Signed)
Progress Note    Destiny Anderson  ION:[id number] DOB: [birth year]  DOA: 11/06/2022 PCP: Sandrea Hughs, NP      Brief Narrative:    Medical records reviewed and are as summarized below:  Destiny Anderson is a 52 y.o. female  with past medical history of 52 year old female with a history of diabetes, hypertension and morbid obesity, who was admitted to the surgical service for incarcerated ventral hernia. She had presented with nausea, vomiting and abdominal pain.  She is status post incarcerated ventral hernia repair on 11/07/2022.  Hospitalist team was consulted for evaluation based on tachycardia and to assist with management of medical problems. Patient is also being treated with oral vancomycin for C. difficile infection with diarrhea.        Assessment/Plan:   Principal Problem:   Ventral hernia with obstruction and without gangrene Active Problems:   Incarcerated ventral hernia   S/P repair of ventral hernia   Sinus tachycardia   Gastroenteritis due to norovirus   Uncontrolled type 2 diabetes mellitus with hyperglycemia, with long-term current use of insulin (HCC)   Hypertension   Depression   Morbid obesity with body mass index of 60 or higher (HCC)   Body mass index is 60.53 kg/m.  (Morbid obesity)  Incarcerated ventral hernia: S/p open ventral hernia repair with mesh on 11/07/2022.  Follow-up with general surgeon.   Gastroenteritis, norovirus infection, possible C. difficile diarrhea, possible sepsis: Stool for C. difficile antigen and PCR were positive but C. difficile toxin was negative.  Continue oral vancomycin.   Sinus tachycardia: Even though she's still tachycardic, heart rate appears to be slowing down.  Continue metoprolol. Tachycardia could be multifactorial including GI losses, recent surgery, GI infection Lasix and HCTZ have been held   Type II DM with hyperglycemia: Continue insulin Levemir and NovoLog as needed.   Other  comorbidities include hypertension, depression, morbid obesity     Diet Order             Diet clear liquid Fluid consistency: Thin  Diet effective now                            Consultants: General surgeon  Procedures: Ventral hernia repair    Medications:    Chlorhexidine Gluconate Cloth  6 each Topical Q0600   desvenlafaxine  100 mg Oral Daily   enoxaparin (LOVENOX) injection  75 mg Subcutaneous Q24H   furosemide  40 mg Oral Daily   gabapentin  300 mg Oral BID   lisinopril  20 mg Oral Daily   And   hydrochlorothiazide  12.5 mg Oral Daily   insulin aspart  0-20 Units Subcutaneous TID WC   insulin detemir  15 Units Subcutaneous BID   metoprolol succinate  25 mg Oral Daily   pantoprazole  40 mg Oral Daily   pravastatin  40 mg Oral Daily   vancomycin  125 mg Oral QID   Continuous Infusions:  sodium chloride 150 mL/hr at 11/08/22 0735     Anti-infectives (From admission, onward)    Start     Dose/Rate Route Frequency Ordered Stop   11/08/22 1000  vancomycin (VANCOCIN) capsule 125 mg        125 mg Oral 4 times daily 11/08/22 0504 11/18/22 0959   11/07/22 0700  ceFAZolin (ANCEF) IVPB 3g/100 mL premix        3 g 200 mL/hr over 30 Minutes Intravenous  Once 11/07/22  3086 11/07/22 5784              Family Communication/Anticipated D/C date and plan/Code Status   DVT prophylaxis: SCD's Start: 11/07/22 1247     Code Status: Full Code  Family Communication: None Disposition Plan: Per surgeon        Subjective:   Interval events noted.  She complains of abdominal pain and watery diarrhea.  No vomiting, chest pain, shortness of breath, leg pain  Objective:    Vitals:   11/08/22 0154 11/08/22 0253 11/08/22 0541 11/08/22 0742  BP:  (!) 106/52 118/60 (!) 142/66  Pulse: (!) 122 (!) 119 (!) 120 (!) 118  Resp:  18 20 19   Temp:  99.7 F (37.6 C) 99.6 F (37.6 C) 98 F (36.7 C)  TempSrc:  Oral    SpO2:  97% 98% 100%  Weight:       Height:       No data found.   Intake/Output Summary (Last 24 hours) at 11/08/2022 1024 Last data filed at 11/08/2022 0327 Gross per 24 hour  Intake 2142.5 ml  Output 750 ml  Net 1392.5 ml   Filed Weights   11/06/22 2020  Weight: (!) 155 kg    Exam:  GEN: NAD SKIN: Warm and dry EYES: No pallor or icterus ENT: MMM CV: RRR PULM: CTA B ABD: soft, obese, NT, +BS + wound VAC to surgical incisional CNS: AAO x 3, non focal EXT: No edema or tenderness        Data Reviewed:   I have personally reviewed following labs and imaging studies:  Labs: Labs show the following:   Basic Metabolic Panel: Recent Labs  Lab 11/06/22 1825 11/07/22 0513 11/07/22 2109-12-01 11/08/22 0344  NA 133* 134* 137 136  K 4.3 4.6 3.4* 3.5  CL 95* 98 100 102  CO2 25 23 26 25   GLUCOSE 395* 420* 150* 227*  BUN 11 19 21* 17  CREATININE 0.68 0.85 0.80 0.75  CALCIUM 9.0 8.8* 7.9* 7.3*   GFR Estimated Creatinine Clearance: 124 mL/min (by C-G formula based on SCr of 0.75 mg/dL). Liver Function Tests: Recent Labs  Lab 11/06/22 1825 11/07/22 0513  AST 36 40  ALT 39 43  ALKPHOS 92 100  BILITOT 0.9 0.7  PROT 7.2 7.5  ALBUMIN 3.4* 3.3*   Recent Labs  Lab 11/06/22 1825  LIPASE 28   No results for input(s): "AMMONIA" in the last 168 hours. Coagulation profile Recent Labs  Lab 11/07/22 December 02, 2151  INR 1.1    CBC: Recent Labs  Lab 11/06/22 1825 11/07/22 0414 11/07/22 2153 11/08/22 0344  WBC 11.5* 10.1 6.2 6.9  HGB 12.9 12.8 11.2* 10.7*  HCT 40.5 41.4 36.2 34.1*  MCV 79.9* 83.3 82.5 82.6  PLT 408* 364 342 314   Cardiac Enzymes: No results for input(s): "CKTOTAL", "CKMB", "CKMBINDEX", "TROPONINI" in the last 168 hours. BNP (last 3 results) No results for input(s): "PROBNP" in the last 8760 hours. CBG: Recent Labs  Lab 11/07/22 1228 11/07/22 1359 11/07/22 1730 11/07/22 2308 11/08/22 0739  GLUCAP 320* 351* 203* 168* 230*   D-Dimer: Recent Labs    11/07/22 Dec 02, 2151  DDIMER  5.02*   Hgb A1c: Recent Labs    11/07/22 0415  HGBA1C 8.5*   Lipid Profile: No results for input(s): "CHOL", "HDL", "LDLCALC", "TRIG", "CHOLHDL", "LDLDIRECT" in the last 72 hours. Thyroid function studies: Recent Labs    11/07/22 December 01, 2109  TSH 1.879   Anemia work up: No results for input(s): "VITAMINB12", "  FOLATE", "FERRITIN", "TIBC", "IRON", "RETICCTPCT" in the last 72 hours. Sepsis Labs: Recent Labs  Lab 11/06/22 1825 11/07/22 0414 11/07/22 2153 11/08/22 0344 11/08/22 0421 11/08/22 0718  PROCALCITON  --   --   --   --  0.16  --   WBC 11.5* 10.1 6.2 6.9  --   --   LATICACIDVEN  --   --   --   --  0.9 1.9    Microbiology Recent Results (from the past 240 hour(s))  Gastrointestinal Panel by PCR , Stool     Status: Abnormal   Collection Time: 11/08/22  1:58 AM   Specimen: Stool  Result Value Ref Range Status   Campylobacter species NOT DETECTED NOT DETECTED Final   Plesimonas shigelloides NOT DETECTED NOT DETECTED Final   Salmonella species NOT DETECTED NOT DETECTED Final   Yersinia enterocolitica NOT DETECTED NOT DETECTED Final   Vibrio species NOT DETECTED NOT DETECTED Final   Vibrio cholerae NOT DETECTED NOT DETECTED Final   Enteroaggregative E coli (EAEC) NOT DETECTED NOT DETECTED Final   Enteropathogenic E coli (EPEC) NOT DETECTED NOT DETECTED Final   Enterotoxigenic E coli (ETEC) NOT DETECTED NOT DETECTED Final   Shiga like toxin producing E coli (STEC) NOT DETECTED NOT DETECTED Final   Shigella/Enteroinvasive E coli (EIEC) NOT DETECTED NOT DETECTED Final   Cryptosporidium NOT DETECTED NOT DETECTED Final   Cyclospora cayetanensis NOT DETECTED NOT DETECTED Final   Entamoeba histolytica NOT DETECTED NOT DETECTED Final   Giardia lamblia NOT DETECTED NOT DETECTED Final   Adenovirus F40/41 NOT DETECTED NOT DETECTED Final   Astrovirus NOT DETECTED NOT DETECTED Final   Norovirus GI/GII DETECTED (A) NOT DETECTED Final    Comment: RESULT CALLED TO, READ BACK BY AND  VERIFIED WITH:  Judithann Sauger AT 0409 11/08/22 JG    Rotavirus A NOT DETECTED NOT DETECTED Final   Sapovirus (I, II, IV, and V) NOT DETECTED NOT DETECTED Final    Comment: Performed at [facility], [address] [place], [place] [address]  C Difficile Quick Screen w PCR reflex     Status: Abnormal   Collection Time: 11/08/22  1:58 AM   Specimen: Stool  Result Value Ref Range Status   C Diff antigen POSITIVE (A) NEGATIVE Final   C Diff toxin NEGATIVE NEGATIVE Final   C Diff interpretation Results are indeterminate. See PCR results.  Final    Comment: Performed at [facility], [address] [place], [place] [address]  C. Diff by PCR, Reflexed     Status: Abnormal   Collection Time: 11/08/22  1:58 AM  Result Value Ref Range Status   Toxigenic C. Difficile by PCR POSITIVE (A) NEGATIVE Final    Comment: Positive for toxigenic C. difficile with little to no toxin production. Only treat if clinical presentation suggests symptomatic illness. Performed at [facility], [address] [place], [place] [address]     Procedures and diagnostic studies:  DG Chest [facility] 1 View  Result Date: 11/07/2022 CLINICAL DATA:  Shortness of breath EXAM: PORTABLE CHEST 1 VIEW COMPARISON:  07/09/2020 FINDINGS: Shallow inspiration. Heart size and pulmonary vascularity are normal for technique. Lungs are clear. No pleural effusions. No pneumothorax. Mediastinal contours appear intact. IMPRESSION: No active disease. Electronically Signed   By: Burman Nieves M.D.   On: 11/07/2022 21:17   CT ABDOMEN PELVIS W CONTRAST  Result Date: 11/06/2022 CLINICAL DATA:  Left lower quadrant pain for 1 day EXAM: CT ABDOMEN AND PELVIS WITH  CONTRAST TECHNIQUE: Multidetector CT imaging of the abdomen and pelvis was performed using the standard protocol following bolus administration of intravenous contrast. RADIATION DOSE REDUCTION: This exam was performed according to the departmental  dose-optimization program which includes automated exposure control, adjustment of the mA and/or kV according to patient size and/or use of iterative reconstruction technique. CONTRAST:  OMNIPAQUE IOHEXOL 350 MG/ML SOLN COMPARISON:  None Available. FINDINGS: Lower chest: No acute abnormality. Small posterior diaphragmatic hernia is noted on the left containing fat. Hepatobiliary: No focal liver abnormality is seen. Status post cholecystectomy. No biliary dilatation. Pancreas: Unremarkable. No pancreatic ductal dilatation or surrounding inflammatory changes. Spleen: Normal in size without focal abnormality. Adrenals/Urinary Tract: Adrenal glands are within normal limits. Kidneys are well visualized bilaterally. No renal calculi or obstructive changes are noted. Bladder is decompressed. Stomach/Bowel: No obstructive or inflammatory changes of the colon are noted. Large anterior abdominal hernia is noted just above an area prior hernia repair. A loop of small bowel is noted within. Fecalization of bowel contents is noted proximal this as well as some mild fluid dilatation of the more proximal small bowel. These changes are consistent with a partial small bowel obstruction secondary to the herniation. The stomach is within normal limits. Vascular/Lymphatic: Aortic atherosclerosis. No enlarged abdominal or pelvic lymph nodes. Reproductive: Uterus and bilateral adnexa are unremarkable. Other: No ascites is noted. Musculoskeletal: Degenerative changes of lumbar spine are noted. IMPRESSION: Changes consistent with partial small bowel obstruction secondary to a herniated loop within a ventral hernia in the inferior abdominal wall just above an area of prior hernia repair. Electronically Signed   By: Alcide Clever M.D.   On: 11/06/2022 21:42               LOS: 1 day   Romey Cohea  [facility]   Pager on [email or web address]. If 7PM-7AM, please contact night-coverage at [email or web address]     11/08/2022,  10:24 AM

## 2022-11-08 NOTE — Plan of Care (Signed)
  Problem: Education: Goal: Ability to describe self-care measures that may prevent or decrease complications (Diabetes Survival Skills Education) will improve Outcome: Progressing Goal: Individualized Educational Video(s) Outcome: Progressing   Problem: Coping: Goal: Ability to adjust to condition or change in health will improve Outcome: Progressing   Problem: Fluid Volume: Goal: Ability to maintain a balanced intake and output will improve Outcome: Progressing   Problem: Health Behavior/Discharge Planning: Goal: Ability to identify and utilize available resources and services will improve Outcome: Progressing Goal: Ability to manage health-related needs will improve Outcome: Progressing   Problem: Metabolic: Goal: Ability to maintain appropriate glucose levels will improve Outcome: Progressing   Problem: Nutritional: Goal: Maintenance of adequate nutrition will improve Outcome: Progressing Goal: Progress toward achieving an optimal weight will improve Outcome: Progressing   Problem: Tissue Perfusion: Goal: Adequacy of tissue perfusion will improve Outcome: Progressing   Problem: Skin Integrity: Goal: Risk for impaired skin integrity will decrease Outcome: Progressing   Problem: Education: Goal: Knowledge of General Education information will improve Description: Including pain rating scale, medication(s)/side effects and non-pharmacologic comfort measures Outcome: Progressing   Problem: Health Behavior/Discharge Planning: Goal: Ability to manage health-related needs will improve Outcome: Progressing   Problem: Clinical Measurements: Goal: Ability to maintain clinical measurements within normal limits will improve Outcome: Progressing Goal: Will remain free from infection Outcome: Progressing Goal: Diagnostic test results will improve Outcome: Progressing Goal: Respiratory complications will improve Outcome: Progressing Goal: Cardiovascular complication will  be avoided Outcome: Progressing   Problem: Activity: Goal: Risk for activity intolerance will decrease Outcome: Progressing   Problem: Nutrition: Goal: Adequate nutrition will be maintained Outcome: Progressing   Problem: Coping: Goal: Level of anxiety will decrease Outcome: Progressing   Problem: Elimination: Goal: Will not experience complications related to bowel motility Outcome: Progressing Goal: Will not experience complications related to urinary retention Outcome: Progressing   Problem: Pain Managment: Goal: General experience of comfort will improve Outcome: Progressing   Problem: Safety: Goal: Ability to remain free from injury will improve Outcome: Progressing

## 2022-11-09 DIAGNOSIS — K436 Other and unspecified ventral hernia with obstruction, without gangrene: Secondary | ICD-10-CM | POA: Diagnosis not present

## 2022-11-09 LAB — BASIC METABOLIC PANEL
Anion gap: 8 (ref 5–15)
BUN: 6 mg/dL (ref 6–20)
CO2: 23 mmol/L (ref 22–32)
Calcium: 6.8 mg/dL — ABNORMAL LOW (ref 8.9–10.3)
Chloride: 99 mmol/L (ref 98–111)
Creatinine, Ser: 0.55 mg/dL (ref 0.44–1.00)
GFR, Estimated: >60 mL/min (ref >60)
Glucose, Bld: 257 mg/dL — ABNORMAL HIGH (ref 70–99)
Potassium: 2.9 mmol/L — ABNORMAL LOW (ref 3.5–5.1)
Sodium: 130 mmol/L — ABNORMAL LOW (ref 135–145)

## 2022-11-09 LAB — GLUCOSE, CAPILLARY
Glucose-Capillary: 224 mg/dL — ABNORMAL HIGH (ref 70–99)
Glucose-Capillary: 292 mg/dL — ABNORMAL HIGH (ref 70–99)
Glucose-Capillary: 317 mg/dL — ABNORMAL HIGH (ref 70–99)
Glucose-Capillary: 328 mg/dL — ABNORMAL HIGH (ref 70–99)

## 2022-11-09 LAB — CBC
HCT: 30.8 % — ABNORMAL LOW (ref 36.0–46.0)
Hemoglobin: 9.9 g/dL — ABNORMAL LOW (ref 12.0–15.0)
MCH: 25.4 pg — ABNORMAL LOW (ref 26.0–34.0)
MCHC: 32.1 g/dL (ref 30.0–36.0)
MCV: 79.2 fL — ABNORMAL LOW (ref 80.0–100.0)
Platelets: 280 10*3/uL (ref 150–400)
RBC: 3.89 MIL/uL (ref 3.87–5.11)
RDW: 15.3 % (ref 11.5–15.5)
WBC: 8.1 10*3/uL (ref 4.0–10.5)
nRBC: 0 % (ref 0.0–0.2)

## 2022-11-09 LAB — SURGICAL PATHOLOGY

## 2022-11-09 MED ORDER — POTASSIUM CHLORIDE 10 MEQ/100ML IV SOLN: 10.0000 meq | INTRAVENOUS | Status: DC

## 2022-11-09 MED ORDER — SODIUM CHLORIDE 0.9 % IV SOLN
INTRAVENOUS | Status: AC
  Filled 2022-11-09: qty 500

## 2022-11-09 MED ORDER — INSULIN DETEMIR 100 UNIT/ML ~~LOC~~ SOLN
20.0000 [IU] | Freq: Two times a day (BID) | SUBCUTANEOUS | Status: DC
  Administered 2022-11-09: 20 [IU] via SUBCUTANEOUS
  Filled 2022-11-09 (×2): qty 0.2

## 2022-11-09 NOTE — Plan of Care (Signed)

## 2022-11-09 NOTE — Progress Notes (Signed)
Patient ID: Destiny Anderson, female   DOB: Jan 06, 1971, 52 y.o.   MRN: [id number]     SURGICAL PROGRESS NOTE   Hospital Day(s): 2.   Interval History: Patient seen and examined, no acute events or new complaints overnight. Patient reports feeling okay this morning.  Patient denies abdominal pain.  Patient denies any nausea or vomiting.  Patient continued having diarrhea but decreasing in frequency.  She continued passing gas.  She denies any problem with liquid liquid diet.  Vital signs in last 24 hours: [min-max] current  Temp:  [98 F (36.7 C)-100.8 F (38.2 C)] 99.3 F (37.4 C) (10/01 0507) Pulse Rate:  [109-120] 120 (10/01 0507) Resp:  [18-20] 18 (10/01 0507) BP: (115-142)/(48-73) 129/73 (10/01 0507) SpO2:  [96 %-100 %] 100 % (10/01 0507)     Height: 5\' 3"  (160 cm) Weight: (!) 155 kg BMI (Calculated): 60.55   Physical Exam:  Constitutional: alert, cooperative and no distress  Respiratory: breathing non-labored at rest  Cardiovascular: regular rate and sinus rhythm  Gastrointestinal: soft, non-tender, and non-distended.  Praveena in place  Labs:     Latest Ref Rng & Units 11/08/2022    3:44 AM 11/07/2022    9:53 PM 11/07/2022    4:14 AM  CBC  WBC 4.0 - 10.5 K/uL 6.9  6.2  10.1   Hemoglobin 12.0 - 15.0 g/dL 41.3  24.4  01.0   Hematocrit 36.0 - 46.0 % 34.1  36.2  41.4   Platelets 150 - 400 K/uL 314  342  364       Latest Ref Rng & Units 11/08/2022    3:44 AM 11/07/2022    9:11 PM 11/07/2022    5:13 AM  CMP  Glucose 70 - 99 mg/dL 272  536  644   BUN 6 - 20 mg/dL 17  21  19    Creatinine 0.44 - 1.00 mg/dL 0.34  7.42  5.95   Sodium 135 - 145 mmol/L 136  137  134   Potassium 3.5 - 5.1 mmol/L 3.5  3.4  4.6   Chloride 98 - 111 mmol/L 102  100  98   CO2 22 - 32 mmol/L 25  26  23    Calcium 8.9 - 10.3 mg/dL 7.3  7.9  8.8   Total Protein 6.5 - 8.1 g/dL   7.5   Total Bilirubin 0.3 - 1.2 mg/dL   0.7   Alkaline Phos 38 - 126 U/L   100   AST 15 - 41 U/L   40   ALT 0 - 44 U/L   43      Imaging studies: No new pertinent imaging studies   Assessment/Plan:  52 y.o. female with ventral hernia with obstruction 2 Days Post-Op s/p ventral hernia repair, complicated by pertinent comorbidities including morbid obesity norovirus infection, C. difficile of diarrhea, uncontrolled diabetes.  -Patient doing well from the ventral hernia repair standpoint.  No nausea or vomiting with the surgically diet.  Will advance as tolerated. -Appreciate hospitalist management of norovirus, C. difficile diarrhea and uncontrolled diabetes -Encouraged the patient to ambulate -Continue pain control  Gae Gallop, MD

## 2022-11-09 NOTE — Inpatient Diabetes Management (Signed)
Inpatient Diabetes Program Recommendations  AACE/ADA: New Consensus Statement on Inpatient Glycemic Control (2015)  Target Ranges:  Prepandial:   less than 140 mg/dL      Peak postprandial:   less than 180 mg/dL (1-2 hours)      Critically ill patients:  140 - 180 mg/dL   Lab Results  Component Value Date   GLUCAP 224 (H) 11/09/2022   HGBA1C 8.5 (H) 11/07/2022    Latest Reference Range & Units 11/08/22 07:39 11/08/22 11:32 11/08/22 17:13 11/08/22 21:41 11/09/22 08:31  Glucose-Capillary 70 - 99 mg/dL 409 (H) 811 (H) 914 (H) 233 (H) 224 (H)  (H): Data is abnormally high  Diabetes history: DM2 Outpatient Diabetes medications: Janumet 50/1000 BID + Levemir 53 BID + Novolog 15 BID  Current orders for Inpatient glycemic control: Levemir 15 BID + Novolog 0-20 TID   Inpatient Diabetes Program Recommendations:   Noted received Decadron 5 mg on 11/07/22. Please consider: -Increase Levemir to 20 units bid -Add Novolog meal coverage when eating  Thank you, Billy Fischer. Detrell Umscheid, RN, MSN, CDE  Diabetes Coordinator Inpatient Glycemic Control Team Team Pager [phone] (8am-5pm) 11/09/2022 10:00 AM

## 2022-11-09 NOTE — Progress Notes (Signed)
PHARMACY CONSULT NOTE  Pharmacy Consult for Electrolyte Monitoring and Replacement   Recent Labs: Potassium (mmol/L)  Date Value  11/09/2022 2.9 (L)  07/10/2013 3.4 (L)   Magnesium (mg/dL)  Date Value  16/11/9602 1.3 (L)   Calcium (mg/dL)  Date Value  54/10/8117 6.8 (L)   Calcium, Total (mg/dL)  Date Value  14/78/2956 8.7   Albumin (g/dL)  Date Value  21/30/8657 3.3 (L)  07/10/2013 3.2 (L)   Sodium (mmol/L)  Date Value  11/09/2022 130 (L)  07/10/2013 137    Assessment:  52 y.o. female with past medical history of 52 year old female with a history of diabetes, hypertension and morbid obesity, currently admitted to the surgical service and is s/p incarcerated ventral hernia repair on 11/07/2022. Pharmacy is asked to follow and replace electrolytes.    Goal of Therapy:  Electrolytes WNL  Plan:  ---50 mEq IV KCl (added to 500 mL NS for ease of administration) ---recheck electrolytes in am  Lowella Bandy ,PharmD Clinical Pharmacist 11/09/2022 11:43 AM

## 2022-11-09 NOTE — Progress Notes (Addendum)
Progress Note    Destiny Anderson  WUJ:[id number] DOB: 1970-05-09  DOA: 11/06/2022 PCP: Sandrea Hughs, NP      Brief Narrative:    Medical records reviewed and are as summarized below:  Destiny Anderson is a 52 y.o. female  with past medical history of 52 year old female with a history of diabetes, hypertension and morbid obesity, who was admitted to the surgical service for incarcerated ventral hernia. She had presented with nausea, vomiting and abdominal pain.  She is status post incarcerated ventral hernia repair on 11/07/2022.  Hospitalist team was consulted for evaluation based on tachycardia and to assist with management of medical problems. Patient is also being treated with oral vancomycin for C. difficile infection with diarrhea.        Assessment/Plan:   Principal Problem:   Ventral hernia with obstruction and without gangrene Active Problems:   Incarcerated ventral hernia   S/P repair of ventral hernia   Sinus tachycardia   Gastroenteritis due to norovirus   Uncontrolled type 2 diabetes mellitus with hyperglycemia, with long-term current use of insulin (HCC)   Hypertension   Depression   Morbid obesity with body mass index of 60 or higher (HCC)   Body mass index is 60.53 kg/m.  (Morbid obesity)  Incarcerated ventral hernia: S/p open ventral hernia repair with mesh on 11/07/2022.  Follow-up with general surgeon.   Gastroenteritis, norovirus infection, possible C. difficile diarrhea, possible sepsis: Stool for C. difficile antigen and PCR were positive but C. difficile toxin was negative.  Continue p.o. vancomycin Discontinue IV fluids   Sinus tachycardia: Even though she's still tachycardic, heart rate appears to be slowing down.  Chart review shows that patient has a history of sinus tachycardia.  Continue metoprolol. Tachycardia could be multifactorial including GI losses, recent surgery, GI infection Hold Lasix for now.  HCTZ is also on  hold.   Type II DM with hyperglycemia: Increase insulin Levemir from 15 to 20 units twice daily.  Continue NovoLog as needed for hyperglycemia.   Other comorbidities include hypertension, depression, morbid obesity     Diet Order             DIET SOFT Fluid consistency: Thin  Diet effective now                            Consultants: General surgeon  Procedures: Ventral hernia repair    Medications:    Chlorhexidine Gluconate Cloth  6 each Topical Q0600   enoxaparin (LOVENOX) injection  75 mg Subcutaneous Q24H   gabapentin  300 mg Oral BID   lisinopril  20 mg Oral Daily   And   hydrochlorothiazide  12.5 mg Oral Daily   insulin aspart  0-20 Units Subcutaneous TID WC   insulin detemir  15 Units Subcutaneous BID   metoprolol succinate  25 mg Oral Daily   pantoprazole  40 mg Oral Daily   pravastatin  40 mg Oral Daily   vancomycin  125 mg Oral QID   Continuous Infusions:  sodium chloride 0.9 % 500 mL with potassium chloride 50 mEq infusion 100 mL/hr at 11/09/22 1236     Anti-infectives (From admission, onward)    Start     Dose/Rate Route Frequency Ordered Stop   11/08/22 1000  vancomycin (VANCOCIN) capsule 125 mg        125 mg Oral 4 times daily 11/08/22 0504 11/18/22 0959   11/07/22 0700  ceFAZolin (ANCEF)  IVPB 3g/100 mL premix        3 g 200 mL/hr over 30 Minutes Intravenous  Once 11/07/22 0648 11/07/22 4098              Family Communication/Anticipated D/C date and plan/Code Status   DVT prophylaxis: SCD's Start: 11/07/22 1247     Code Status: Full Code  Family Communication: None Disposition Plan: Per surgeon        Subjective:   She still has diarrhea.  Abdominal pain is a little better.  No vomiting.  Objective:    Vitals:   11/08/22 1938 11/08/22 2140 11/09/22 0507 11/09/22 0829  BP: (!) 132/48  129/73 (!) 138/59  Pulse: (!) 116 (!) 119 (!) 120 (!) 115  Resp: 20  18 20   Temp: 99.7 F (37.6 C)  99.3 F (37.4  C) 99.7 F (37.6 C)  TempSrc: Oral  Oral   SpO2: 96% 100% 100% 99%  Weight:      Height:       No data found.   Intake/Output Summary (Last 24 hours) at 11/09/2022 1420 Last data filed at 11/09/2022 0805 Gross per 24 hour  Intake 4702.5 ml  Output 2275 ml  Net 2427.5 ml   Filed Weights   11/06/22 2020  Weight: (!) 155 kg    Exam:  GEN: NAD SKIN: Warm and dry EYES: No pallor or icterus ENT: MMM CV: RRR PULM: CTA B ABD: soft, obese, NT, +BS, + wound VAC to surgical incisional wound CNS: AAO x 3, non focal EXT: No edema or tenderness       Data Reviewed:   I have personally reviewed following labs and imaging studies:  Labs: Labs show the following:   Basic Metabolic Panel: Recent Labs  Lab 11/06/22 1825 11/07/22 0513 11/07/22 2111 11/08/22 0344 11/09/22 0857  NA 133* 134* 137 136 130*  K 4.3 4.6 3.4* 3.5 2.9*  CL 95* 98 100 102 99  CO2 25 23 26 25 23   GLUCOSE 395* 420* 150* 227* 257*  BUN 11 19 21* 17 6  CREATININE 0.68 0.85 0.80 0.75 0.55  CALCIUM 9.0 8.8* 7.9* 7.3* 6.8*   GFR Estimated Creatinine Clearance: 124 mL/min (by C-G formula based on SCr of 0.55 mg/dL). Liver Function Tests: Recent Labs  Lab 11/06/22 1825 11/07/22 0513  AST 36 40  ALT 39 43  ALKPHOS 92 100  BILITOT 0.9 0.7  PROT 7.2 7.5  ALBUMIN 3.4* 3.3*   Recent Labs  Lab 11/06/22 1825  LIPASE 28   No results for input(s): "AMMONIA" in the last 168 hours. Coagulation profile Recent Labs  Lab 11/07/22 2153  INR 1.1    CBC: Recent Labs  Lab 11/06/22 1825 11/07/22 0414 11/07/22 2153 11/08/22 0344 11/09/22 0857  WBC 11.5* 10.1 6.2 6.9 8.1  HGB 12.9 12.8 11.2* 10.7* 9.9*  HCT 40.5 41.4 36.2 34.1* 30.8*  MCV 79.9* 83.3 82.5 82.6 79.2*  PLT 408* 364 342 314 280   Cardiac Enzymes: No results for input(s): "CKTOTAL", "CKMB", "CKMBINDEX", "TROPONINI" in the last 168 hours. BNP (last 3 results) No results for input(s): "PROBNP" in the last 8760  hours. CBG: Recent Labs  Lab 11/08/22 1132 11/08/22 1713 11/08/22 2141 11/09/22 0831 11/09/22 1235  GLUCAP 273* 251* 233* 224* 292*   D-Dimer: Recent Labs    11/07/22 2153  DDIMER 5.02*   Hgb A1c: Recent Labs    11/07/22 0415  HGBA1C 8.5*   Lipid Profile: No results for input(s): "CHOL", "HDL", "LDLCALC", "  TRIG", "CHOLHDL", "LDLDIRECT" in the last 72 hours. Thyroid function studies: Recent Labs    11/07/22 December 08, 2109  TSH 1.879   Anemia work up: No results for input(s): "VITAMINB12", "FOLATE", "FERRITIN", "TIBC", "IRON", "RETICCTPCT" in the last 72 hours. Sepsis Labs: Recent Labs  Lab 11/07/22 0414 11/07/22 December 09, 2151 11/08/22 0344 11/08/22 0421 11/08/22 0718 11/09/22 0857  PROCALCITON  --   --   --  0.16  --   --   WBC 10.1 6.2 6.9  --   --  8.1  LATICACIDVEN  --   --   --  0.9 1.9  --     Microbiology Recent Results (from the past 240 hour(s))  Gastrointestinal Panel by PCR , Stool     Status: Abnormal   Collection Time: 11/08/22  1:58 AM   Specimen: Stool  Result Value Ref Range Status   Campylobacter species NOT DETECTED NOT DETECTED Final   Plesimonas shigelloides NOT DETECTED NOT DETECTED Final   Salmonella species NOT DETECTED NOT DETECTED Final   Yersinia enterocolitica NOT DETECTED NOT DETECTED Final   Vibrio species NOT DETECTED NOT DETECTED Final   Vibrio cholerae NOT DETECTED NOT DETECTED Final   Enteroaggregative E coli (EAEC) NOT DETECTED NOT DETECTED Final   Enteropathogenic E coli (EPEC) NOT DETECTED NOT DETECTED Final   Enterotoxigenic E coli (ETEC) NOT DETECTED NOT DETECTED Final   Shiga like toxin producing E coli (STEC) NOT DETECTED NOT DETECTED Final   Shigella/Enteroinvasive E coli (EIEC) NOT DETECTED NOT DETECTED Final   Cryptosporidium NOT DETECTED NOT DETECTED Final   Cyclospora cayetanensis NOT DETECTED NOT DETECTED Final   Entamoeba histolytica NOT DETECTED NOT DETECTED Final   Giardia lamblia NOT DETECTED NOT DETECTED Final    Adenovirus F40/41 NOT DETECTED NOT DETECTED Final   Astrovirus NOT DETECTED NOT DETECTED Final   Norovirus GI/GII DETECTED (A) NOT DETECTED Final    Comment: RESULT CALLED TO, READ BACK BY AND VERIFIED WITH:  Judithann Sauger AT 0409 11/08/22 JG    Rotavirus A NOT DETECTED NOT DETECTED Final   Sapovirus (I, II, IV, and V) NOT DETECTED NOT DETECTED Final    Comment: Performed at [facility], [address] [place], [place] [address]  C Difficile Quick Screen w PCR reflex     Status: Abnormal   Collection Time: 11/08/22  1:58 AM   Specimen: Stool  Result Value Ref Range Status   C Diff antigen POSITIVE (A) NEGATIVE Final   C Diff toxin NEGATIVE NEGATIVE Final   C Diff interpretation Results are indeterminate. See PCR results.  Final    Comment: Performed at [facility], [address] [place], [place] [address]  C. Diff by PCR, Reflexed     Status: Abnormal   Collection Time: 11/08/22  1:58 AM  Result Value Ref Range Status   Toxigenic C. Difficile by PCR POSITIVE (A) NEGATIVE Final    Comment: Positive for toxigenic C. difficile with little to no toxin production. Only treat if clinical presentation suggests symptomatic illness. Performed at [facility], [address] [place], [place] [address]     Procedures and diagnostic studies:  DG Chest [facility] 1 View  Result Date: 11/07/2022 CLINICAL DATA:  Shortness of breath EXAM: PORTABLE CHEST 1 VIEW COMPARISON:  07/09/2020 FINDINGS: Shallow inspiration. Heart size and pulmonary vascularity are normal for technique. Lungs are clear. No pleural effusions. No pneumothorax. Mediastinal contours appear intact. IMPRESSION: No active disease. Electronically Signed   By: Marisa Cyphers.D.  On: 11/07/2022 21:17               LOS: 2 days   Linton Stolp  [facility]   Pager on [email or web address]. If 7PM-7AM, please contact night-coverage at [email or web address]     11/09/2022, 2:20 PM

## 2022-11-09 NOTE — Evaluation (Signed)
Physical Therapy Evaluation Patient Details Name: Destiny Anderson MRN: [id number] DOB: 1970/06/20 Today's Date: 11/09/2022  History of Present Illness  Patient is a 52 y.o. female who was referred by [facility] for evaluation of ventral hernia.  Symptoms were first noted 2 days ago. Pain is sharp, localized slightly left to umbilical area.  Associated with nausea, exacerbated by touch. PMH includes: depression, diabetes, hypercholesteremia, hypotension. Current MD assessment: s/p ventral hermia repair with mesh.  Clinical Impression  Pt was pleasant and motivated to participate during the session and put forth good effort throughout. Pt is currently Independent with transfers and ambulation with no use of AD. Pt able to amb ~40 feet in room taking steady steps with no imbalance noted. Pt has not had a decline in functional ability. She has no DME needs, and no skilled PT needs identified at this time. Will complete PT orders at this time but will reassess pt pending a change in status upon receipt of new PT orders.          If plan is discharge home, recommend the following: Assist for transportation   Can travel by private vehicle        Equipment Recommendations None recommended by PT  Recommendations for Other Services       Functional Status Assessment Patient has not had a recent decline in their functional status     Precautions / Restrictions Precautions Precautions: Fall Restrictions Weight Bearing Restrictions: No      Mobility  Bed Mobility Overal bed mobility: Independent                  Transfers Overall transfer level: Independent                 General transfer comment: Ind with self selected strategy    Ambulation/Gait Ambulation/Gait assistance: Independent Gait Distance (Feet): 40 Feet Assistive device: None Gait Pattern/deviations: Step-through pattern, WFL(Within Functional Limits)       General Gait Details: pt taking steady  steps, no use of AD, no imbalance noted including during sharp 180 degrees turns in room and backwards amb in room.  Stairs            Wheelchair Mobility     Tilt Bed    Modified Rankin (Stroke Patients Only)       Balance Overall balance assessment: No apparent balance deficits (not formally assessed)                                           Pertinent Vitals/Pain Pain Assessment Pain Assessment: No/denies pain    Home Living Family/patient expects to be discharged to:: Private residence Living Arrangements: Children Available Help at Discharge: Family;Available 24 hours/day Type of Home: House Home Access: Stairs to enter Entrance Stairs-Rails: Right (ascending) Entrance Stairs-Number of Steps: 3   Home Layout: Two level;Able to live on main level with bedroom/bathroom Home Equipment: None      Prior Function Prior Level of Function : Independent/Modified Independent             Mobility Comments: Ind, limited community ambulator ADLs Comments: shower and bathroom use ind     Extremity/Trunk Assessment   Upper Extremity Assessment Upper Extremity Assessment: Overall WFL for tasks assessed    Lower Extremity Assessment Lower Extremity Assessment: Overall WFL for tasks assessed       Communication   Communication  Communication: No apparent difficulties  Cognition Arousal: Alert Behavior During Therapy: WFL for tasks assessed/performed Overall Cognitive Status: Within Functional Limits for tasks assessed                                          General Comments      Exercises     Assessment/Plan    PT Assessment Patient does not need any further PT services  PT Problem List         PT Treatment Interventions      PT Goals (Current goals can be found in the Care Plan section)  Acute Rehab PT Goals PT Goal Formulation: All assessment and education complete, DC therapy    Frequency        Co-evaluation               AM-PAC PT "6 Clicks" Mobility  Outcome Measure Help needed turning from your back to your side while in a flat bed without using bedrails?: None Help needed moving from lying on your back to sitting on the side of a flat bed without using bedrails?: None Help needed moving to and from a bed to a chair (including a wheelchair)?: None Help needed standing up from a chair using your arms (e.g., wheelchair or bedside chair)?: None Help needed to walk in hospital room?: None Help needed climbing 3-5 steps with a railing? : None 6 Click Score: 24    End of Session   Activity Tolerance: Patient tolerated treatment well Patient left: in bed;with call bell/phone within reach (EOB) Nurse Communication: Mobility status PT Visit Diagnosis: Other abnormalities of gait and mobility (R26.89);Difficulty in walking, not elsewhere classified (R26.2)    Time: 1410-1436 PT Time Calculation (min) (ACUTE ONLY): 26 min   Charges:                 Cecile Sheerer, SPT 11/09/22, 3:16 PM

## 2022-11-10 DIAGNOSIS — K436 Other and unspecified ventral hernia with obstruction, without gangrene: Secondary | ICD-10-CM | POA: Diagnosis not present

## 2022-11-10 LAB — GLUCOSE, CAPILLARY
Glucose-Capillary: 253 mg/dL — ABNORMAL HIGH (ref 70–99)
Glucose-Capillary: 361 mg/dL — ABNORMAL HIGH (ref 70–99)

## 2022-11-10 LAB — PHOSPHORUS: Phosphorus: 1.9 mg/dL — ABNORMAL LOW (ref 2.5–4.6)

## 2022-11-10 LAB — BASIC METABOLIC PANEL
Anion gap: 8 (ref 5–15)
BUN: 7 mg/dL (ref 6–20)
CO2: 24 mmol/L (ref 22–32)
Calcium: 7.1 mg/dL — ABNORMAL LOW (ref 8.9–10.3)
Chloride: 99 mmol/L (ref 98–111)
Creatinine, Ser: 0.62 mg/dL (ref 0.44–1.00)
GFR, Estimated: >60 mL/min (ref >60)
Glucose, Bld: 269 mg/dL — ABNORMAL HIGH (ref 70–99)
Potassium: 3.1 mmol/L — ABNORMAL LOW (ref 3.5–5.1)
Sodium: 131 mmol/L — ABNORMAL LOW (ref 135–145)

## 2022-11-10 LAB — MAGNESIUM: Magnesium: 1.6 mg/dL — ABNORMAL LOW (ref 1.7–2.4)

## 2022-11-10 MED ORDER — OXYCODONE HCL 5 MG PO TABS: 5.0000 mg | ORAL_TABLET | Freq: Four times a day (QID) | ORAL | 0 refills | Status: AC | PRN

## 2022-11-10 MED ORDER — MAGNESIUM OXIDE -MG SUPPLEMENT 400 (240 MG) MG PO TABS
400.0000 mg | ORAL_TABLET | Freq: Three times a day (TID) | ORAL | Status: DC
  Administered 2022-11-10 (×2): 400 mg via ORAL
  Filled 2022-11-10 (×2): qty 1

## 2022-11-10 MED ORDER — K PHOS MONO-SOD PHOS DI & MONO 155-852-130 MG PO TABS
500.0000 mg | ORAL_TABLET | Freq: Two times a day (BID) | ORAL | Status: DC
  Administered 2022-11-10: 500 mg via ORAL
  Filled 2022-11-10 (×2): qty 2

## 2022-11-10 MED ORDER — MAGNESIUM SULFATE 2 GM/50ML IV SOLN: 2.0000 g | Freq: Once | INTRAVENOUS | Status: DC

## 2022-11-10 MED ORDER — POTASSIUM CHLORIDE CRYS ER 20 MEQ PO TBCR
40.0000 meq | EXTENDED_RELEASE_TABLET | Freq: Once | ORAL | Status: AC
  Administered 2022-11-10: 40 meq via ORAL
  Filled 2022-11-10: qty 2

## 2022-11-10 MED ORDER — POTASSIUM PHOSPHATES 15 MMOLE/5ML IV SOLN
30.0000 mmol | Freq: Once | INTRAVENOUS | Status: DC
  Filled 2022-11-10: qty 10

## 2022-11-10 MED ORDER — INSULIN DETEMIR 100 UNIT/ML ~~LOC~~ SOLN
23.0000 [IU] | Freq: Two times a day (BID) | SUBCUTANEOUS | Status: DC
  Filled 2022-11-10: qty 0.23

## 2022-11-10 MED ORDER — VANCOMYCIN HCL 125 MG PO CAPS: 125.0000 mg | ORAL_CAPSULE | Freq: Four times a day (QID) | ORAL | 0 refills | Status: AC

## 2022-11-10 MED ORDER — INSULIN DETEMIR 100 UNIT/ML ~~LOC~~ SOLN
30.0000 [IU] | Freq: Two times a day (BID) | SUBCUTANEOUS | Status: DC
  Filled 2022-11-10: qty 0.3

## 2022-11-10 NOTE — Discharge Instructions (Signed)
  Diet: Resume home heart healthy regular diet.   Activity: No heavy lifting >10 pounds (children, pets, laundry, garbage) or strenuous activity until follow-up, but light activity and walking are encouraged. Do not drive or drink alcohol if taking narcotic pain medications.  Wound care: May shower with soapy water and pat dry (do not rub incisions), but no baths or submerging incision underwater until follow-up. (no swimming)   Medications: Resume all home medications. For mild to moderate pain: acetaminophen (Tylenol) or ibuprofen (if no kidney disease). Combining Tylenol with alcohol can substantially increase your risk of causing liver disease. Narcotic pain medications, if prescribed, can be used for severe pain, though may cause nausea, constipation, and drowsiness.  If you do not need the narcotic pain medication, you do not need to fill the prescription.  Call office [phone]) at any time if any questions, worsening pain, fevers/chills, bleeding, drainage from incision site, or other concerns.

## 2022-11-10 NOTE — Progress Notes (Signed)
PROGRESS NOTE    Destiny Anderson  ZOX:[id number] DOB: Jul 08, 1970 DOA: 11/06/2022 PCP: Sandrea Hughs, NP    Brief Narrative:   Destiny Anderson is a 52 y.o. female  with past medical history of 52 year old female with a history of diabetes, hypertension and morbid obesity, who was admitted to the surgical service for incarcerated ventral hernia. She had presented with nausea, vomiting and abdominal pain.  She is status post incarcerated ventral hernia repair on 11/07/2022.  Hospitalist team was consulted for evaluation based on tachycardia and to assist with management of medical problems. Patient is also being treated with oral vancomycin for C. difficile infection with diarrhea.     Assessment & Plan:   Principal Problem:   Ventral hernia with obstruction and without gangrene Active Problems:   Incarcerated ventral hernia   S/P repair of ventral hernia   Sinus tachycardia   Gastroenteritis due to norovirus   Uncontrolled type 2 diabetes mellitus with hyperglycemia, with long-term current use of insulin (HCC)   Hypertension   Depression   Morbid obesity with body mass index of 60 or higher (HCC)  Infectious diarrhea C. difficile colitis Norovirus infection Likely that infectious diarrhea and resultant electrolyte and volume loss responsible for persistent sinus tachycardia.  Heart rate slowly improving, mildly tachycardic.  Patient states that her stools are becoming more formed. Plan: Continue p.o. vancomycin 125 mg 4 times daily x 10 total days Discussed care plan with general surgery attending Dr. Earlie Server from medicine standpoint for discharge  Sinus tachycardia Heart rate is improving.  Likely secondary to infectious diarrhea as above.  Continue with oral vancomycin treatment for COVID difficile colitis.   Continue holding Lasix and hydrochlorothiazide.  Can restart these medications on discharge  Type 2 diabetes mellitus with hyperglycemia Increase Semglee 30  units twice daily NovoLog sliding scale Carb modified diet   Incarcerated ventral hernia: S/p open ventral hernia repair with mesh on 11/07/2022.  General surgery is primary attending      Other comorbidities include hypertension, depression, morbid obesity   DVT prophylaxis: Per general surgery Code Status: Full Family Communication: None Disposition Plan: Status is: Inpatient Remains inpatient appropriate because: Disposition plan per general surgery attending   Level of care: Med-Surg  Consultants:  Hospitalist  Procedures:  Ventral hernia repair  Antimicrobials: None   Subjective: Patient examined.  Sitting up on edge of bed.  Visibly no distress.  Objective: Vitals:   11/09/22 1714 11/09/22 1944 11/10/22 0425 11/10/22 0456  BP: 123/65 122/85 (!) 144/69 138/77  Pulse: (!) 108 (!) 105 (!) 108 (!) 104  Resp: 20 18 17 18   Temp: 99.5 F (37.5 C) 99.3 F (37.4 C) 98.3 F (36.8 C) 98.9 F (37.2 C)  TempSrc: Oral Oral  Oral  SpO2: 100% 99% 100% 98%  Weight:      Height:        Intake/Output Summary (Last 24 hours) at 11/10/2022 1437 Last data filed at 11/10/2022 1239 Gross per 24 hour  Intake 240 ml  Output 300 ml  Net -60 ml   Filed Weights   11/06/22 2020  Weight: (!) 155 kg    Examination:  General exam: Appears calm and comfortable  Respiratory system: Clear to auscultation. Respiratory effort normal. Cardiovascular system: S1-S2, RRR, no murmurs, no pedal edema Gastrointestinal system: Obese, soft, NT/ND, hyperactive bowel sounds Central nervous system: Alert and oriented. No focal neurological deficits. Extremities: Symmetric 5 x 5 power. Skin: No rashes, lesions or ulcers Psychiatry: Judgement and  insight appear normal. Mood & affect appropriate.     Data Reviewed: I have personally reviewed following labs and imaging studies  CBC: Recent Labs  Lab 11/06/22 1825 11/07/22 0414 11/07/22 2153 11/08/22 0344 11/09/22 0857  WBC 11.5*  10.1 6.2 6.9 8.1  HGB 12.9 12.8 11.2* 10.7* 9.9*  HCT 40.5 41.4 36.2 34.1* 30.8*  MCV 79.9* 83.3 82.5 82.6 79.2*  PLT 408* 364 342 314 280   Basic Metabolic Panel: Recent Labs  Lab 11/07/22 0513 11/07/22 2111 11/08/22 0344 11/09/22 0857 11/10/22 0403  NA 134* 137 136 130* 131*  K 4.6 3.4* 3.5 2.9* 3.1*  CL 98 100 102 99 99  CO2 23 26 25 23 24   GLUCOSE 420* 150* 227* 257* 269*  BUN 19 21* 17 6 7   CREATININE 0.85 0.80 0.75 0.55 0.62  CALCIUM 8.8* 7.9* 7.3* 6.8* 7.1*  MG  --   --   --   --  1.6*  PHOS  --   --   --   --  1.9*   GFR: Estimated Creatinine Clearance: 124 mL/min (by C-G formula based on SCr of 0.62 mg/dL). Liver Function Tests: Recent Labs  Lab 11/06/22 1825 11/07/22 0513  AST 36 40  ALT 39 43  ALKPHOS 92 100  BILITOT 0.9 0.7  PROT 7.2 7.5  ALBUMIN 3.4* 3.3*   Recent Labs  Lab 11/06/22 1825  LIPASE 28   No results for input(s): "AMMONIA" in the last 168 hours. Coagulation Profile: Recent Labs  Lab 11/07/22 2153  INR 1.1   Cardiac Enzymes: No results for input(s): "CKTOTAL", "CKMB", "CKMBINDEX", "TROPONINI" in the last 168 hours. BNP (last 3 results) No results for input(s): "PROBNP" in the last 8760 hours. HbA1C: No results for input(s): "HGBA1C" in the last 72 hours. CBG: Recent Labs  Lab 11/09/22 1235 11/09/22 1711 11/09/22 2140 11/10/22 0837 11/10/22 1130  GLUCAP 292* 317* 328* 253* 361*   Lipid Profile: No results for input(s): "CHOL", "HDL", "LDLCALC", "TRIG", "CHOLHDL", "LDLDIRECT" in the last 72 hours. Thyroid Function Tests: Recent Labs    11/07/22 2111  TSH 1.879   Anemia Panel: No results for input(s): "VITAMINB12", "FOLATE", "FERRITIN", "TIBC", "IRON", "RETICCTPCT" in the last 72 hours. Sepsis Labs: Recent Labs  Lab 11/08/22 0421 11/08/22 0718  PROCALCITON 0.16  --   LATICACIDVEN 0.9 1.9    Recent Results (from the past 240 hour(s))  Gastrointestinal Panel by PCR , Stool     Status: Abnormal   Collection  Time: 11/08/22  1:58 AM   Specimen: Stool  Result Value Ref Range Status   Campylobacter species NOT DETECTED NOT DETECTED Final   Plesimonas shigelloides NOT DETECTED NOT DETECTED Final   Salmonella species NOT DETECTED NOT DETECTED Final   Yersinia enterocolitica NOT DETECTED NOT DETECTED Final   Vibrio species NOT DETECTED NOT DETECTED Final   Vibrio cholerae NOT DETECTED NOT DETECTED Final   Enteroaggregative E coli (EAEC) NOT DETECTED NOT DETECTED Final   Enteropathogenic E coli (EPEC) NOT DETECTED NOT DETECTED Final   Enterotoxigenic E coli (ETEC) NOT DETECTED NOT DETECTED Final   Shiga like toxin producing E coli (STEC) NOT DETECTED NOT DETECTED Final   Shigella/Enteroinvasive E coli (EIEC) NOT DETECTED NOT DETECTED Final   Cryptosporidium NOT DETECTED NOT DETECTED Final   Cyclospora cayetanensis NOT DETECTED NOT DETECTED Final   Entamoeba histolytica NOT DETECTED NOT DETECTED Final   Giardia lamblia NOT DETECTED NOT DETECTED Final   Adenovirus F40/41 NOT DETECTED NOT DETECTED Final  Astrovirus NOT DETECTED NOT DETECTED Final   Norovirus GI/GII DETECTED (A) NOT DETECTED Final    Comment: RESULT CALLED TO, READ BACK BY AND VERIFIED WITH:  Judithann Sauger AT 0409 11/08/22 JG    Rotavirus A NOT DETECTED NOT DETECTED Final   Sapovirus (I, II, IV, and V) NOT DETECTED NOT DETECTED Final    Comment: Performed at [facility], [address] [place], [place] [address]  C Difficile Quick Screen w PCR reflex     Status: Abnormal   Collection Time: 11/08/22  1:58 AM   Specimen: Stool  Result Value Ref Range Status   C Diff antigen POSITIVE (A) NEGATIVE Final   C Diff toxin NEGATIVE NEGATIVE Final   C Diff interpretation Results are indeterminate. See PCR results.  Final    Comment: Performed at [facility], [address] [place], [place] [address]  C. Diff by PCR, Reflexed     Status: Abnormal   Collection Time: 11/08/22  1:58 AM  Result Value Ref Range Status    Toxigenic C. Difficile by PCR POSITIVE (A) NEGATIVE Final    Comment: Positive for toxigenic C. difficile with little to no toxin production. Only treat if clinical presentation suggests symptomatic illness. Performed at [facility], [address], [place], [place] [address]          Radiology Studies: No results found.      Scheduled Meds:  Chlorhexidine Gluconate Cloth  6 each Topical Q0600   enoxaparin (LOVENOX) injection  75 mg Subcutaneous Q24H   gabapentin  300 mg Oral BID   lisinopril  20 mg Oral Daily   And   hydrochlorothiazide  12.5 mg Oral Daily   insulin aspart  0-20 Units Subcutaneous TID WC   insulin detemir  30 Units Subcutaneous BID   magnesium oxide  400 mg Oral TID   metoprolol succinate  25 mg Oral Daily   pantoprazole  40 mg Oral Daily   phosphorus  500 mg Oral BID   pravastatin  40 mg Oral Daily   vancomycin  125 mg Oral QID   Continuous Infusions:   LOS: 3 days     Tresa Moore, MD [facility]   If 7PM-7AM, please contact night-coverage  11/10/2022, 2:37 PM

## 2022-11-10 NOTE — Progress Notes (Signed)
PHARMACY CONSULT NOTE  Pharmacy Consult for Electrolyte Monitoring and Replacement   Recent Labs: Potassium (mmol/L)  Date Value  11/10/2022 3.1 (L)  07/10/2013 3.4 (L)   Magnesium (mg/dL)  Date Value  16/11/9602 1.6 (L)  05/11/2011 1.3 (L)   Calcium (mg/dL)  Date Value  54/10/8117 7.1 (L)   Calcium, Total (mg/dL)  Date Value  14/78/2956 8.7   Albumin (g/dL)  Date Value  21/30/8657 3.3 (L)  07/10/2013 3.2 (L)   Phosphorus (mg/dL)  Date Value  84/69/6295 1.9 (L)   Sodium (mmol/L)  Date Value  11/10/2022 131 (L)  07/10/2013 137    Assessment:  52 y.o. female with past medical history of 52 year old female with a history of diabetes, hypertension and morbid obesity, currently admitted to the surgical service and is s/p incarcerated ventral hernia repair on 11/07/2022. Pharmacy is asked to follow and replace electrolytes.    Goal of Therapy:  Electrolytes WNL  Plan:  ---400 mg magnesium oxide po x 3 ---500 mg K-Phos x 2 (each dose contains contains phosphorus 16 mMol, potassium 2.2 mEq) ---40 mEq po KCl x 1 ---recheck electrolytes in am  Lowella Bandy ,PharmD Clinical Pharmacist 11/10/2022 7:20 AM

## 2022-11-10 NOTE — Inpatient Diabetes Management (Signed)
Inpatient Diabetes Program Recommendations  AACE/ADA: New Consensus Statement on Inpatient Glycemic Control (2015)  Target Ranges:  Prepandial:   less than 140 mg/dL      Peak postprandial:   less than 180 mg/dL (1-2 hours)      Critically ill patients:  140 - 180 mg/dL   Lab Results  Component Value Date   GLUCAP 253 (H) 11/10/2022   HGBA1C 8.5 (H) 11/07/2022    Latest Reference Range & Units 11/09/22 08:31 11/09/22 12:35 11/09/22 17:11 11/09/22 21:40 11/10/22 08:37  Glucose-Capillary 70 - 99 mg/dL 098 (H) 119 (H) 147 (H) 328 (H) 253 (H)  (H): Data is abnormally high  Diabetes history: DM2 Outpatient Diabetes medications: Janumet 50/1000 BID + Levemir 53 BID + Novolog 15 BID  Current orders for Inpatient glycemic control: Levemir 23 BID + Novolog 0-20 TID   Inpatient Diabetes Program Recommendations:   Please consider: -Increase Levemir to 30 units bid -Add Novolog 5 units tid meal coverage if eats 50% meal -Add carb mod  to diet if appropriate  Thank you, Darel Hong E. Kemoni Quesenberry, RN, MSN, CDE  Diabetes Coordinator Inpatient Glycemic Control Team Team Pager [phone] (8am-5pm) 11/10/2022 9:50 AM

## 2022-11-10 NOTE — Discharge Summary (Signed)
Patient ID: Destiny Anderson MRN: [id number] DOB/AGE: [birth year] 52 y.o.  Admit date: 11/06/2022 Discharge date: 11/10/2022   Discharge Diagnoses:  Principal Problem:   Ventral hernia with obstruction and without gangrene Active Problems:   Incarcerated ventral hernia   Sinus tachycardia   Uncontrolled type 2 diabetes mellitus with hyperglycemia, with long-term current use of insulin (HCC)   Hypertension   Depression   Morbid obesity with body mass index of 60 or higher (HCC)   S/P repair of ventral hernia   Gastroenteritis due to norovirus   Procedures: Ventral hernia repair  Hospital Course: Patient with ventral hernia repair with obstruction.  She underwent ventral hernia repair.  She has been recovering well.  The patient is tolerating diet.  Patient is having bowel movement.  Patient also complicated with C. difficile infection treated with oral vancomycin.  Diarrhea has improved.  As per hospitalist patient safe to discharge with oral vancomycin.  Patient is able to ambulate.  Physical Exam Vitals reviewed.  HENT:     Head: Normocephalic.  Cardiovascular:     Rate and Rhythm: Normal rate and regular rhythm.  Pulmonary:     Effort: Pulmonary effort is normal.     Breath sounds: Normal breath sounds.  Abdominal:     General: Abdomen is flat. Bowel sounds are normal. There is no distension.     Palpations: Abdomen is soft.     Hernia: No hernia is present.  Skin:    General: Skin is warm.     Capillary Refill: Capillary refill takes less than 2 seconds.  Neurological:     Mental Status: She is alert and oriented to person, place, and time.      Consults: Hospitalist  Disposition: Discharge disposition: 01-Home or Self Care       Discharge Instructions     Diet - low sodium heart healthy   Complete by: As directed    Increase activity slowly   Complete by: As directed       Allergies as of 11/10/2022   No Known Allergies      Medication List      TAKE these medications    desvenlafaxine 100 MG 24 hr tablet Commonly known as: PRISTIQ Take 100 mg by mouth daily.   ferrous sulfate 325 (65 FE) MG EC tablet Take 1 tablet (325 mg total) by mouth daily with breakfast.   furosemide 40 MG tablet Commonly known as: LASIX Take 40 mg by mouth daily.   gabapentin 300 MG capsule Commonly known as: NEURONTIN Take 600 mg by mouth 2 (two) times daily.   insulin regular human CONCENTRATED 500 UNIT/ML injection Commonly known as: HUMULIN R Inject 80-120 Units into the skin 2 (two) times daily with a meal. 120u am 80u pm   lisinopril-hydrochlorothiazide 20-12.5 MG tablet Commonly known as: ZESTORETIC Take 2 tablets by mouth daily.   metoprolol succinate 25 MG 24 hr tablet Commonly known as: TOPROL-XL Take 25 mg by mouth daily.   oxyCODONE 5 MG immediate release tablet Commonly known as: Oxy IR/ROXICODONE Take 1 tablet (5 mg total) by mouth every 6 (six) hours as needed for severe pain.   pravastatin 40 MG tablet Commonly known as: PRAVACHOL Take 40 mg by mouth daily.   sitaGLIPtin-metformin 50-1000 MG tablet Commonly known as: JANUMET Take 1 tablet by mouth 2 (two) times daily with a meal.   vancomycin 125 MG capsule Commonly known as: VANCOCIN Take 1 capsule (125 mg total) by mouth 4 (four) times daily  for 8 days.        Follow-up Information     Richland, Isami, DO. Go on 11/26/2022.   Specialties: General Surgery, Surgery Why: Follow up after ventral hernia repair. Go at 9:45am. Contact information: [address] [place] [address] [phone]                 This discharge count was for 35-minute most of the time counseling the patient and coordinating plan of care.

## 2022-12-01 ENCOUNTER — Other Ambulatory Visit: Payer: Self-pay | Admitting: Surgery

## 2022-12-01 DIAGNOSIS — L02211 Cutaneous abscess of abdominal wall: Secondary | ICD-10-CM

## 2022-12-02 ENCOUNTER — Ambulatory Visit
Admission: RE | Admit: 2022-12-02 | Discharge: 2022-12-02 | Disposition: A | Discharge status: Home / Self Care | Payer: Medicaid Other | Source: Ambulatory Visit | Attending: Surgery | Admitting: Surgery

## 2022-12-02 DIAGNOSIS — L02211 Cutaneous abscess of abdominal wall: Secondary | ICD-10-CM | POA: Insufficient documentation

## 2022-12-21 ENCOUNTER — Other Ambulatory Visit
Admission: RE | Admit: 2022-12-21 | Discharge: 2022-12-21 | Disposition: A | Discharge status: Home / Self Care | Payer: Medicaid Other | Source: Ambulatory Visit | Attending: Surgery | Admitting: Surgery

## 2022-12-21 DIAGNOSIS — R0602 Shortness of breath: Secondary | ICD-10-CM | POA: Insufficient documentation

## 2022-12-21 LAB — D-DIMER, QUANTITATIVE: D-Dimer, Quant: 1.92 ug{FEU}/mL — ABNORMAL HIGH (ref 0.00–0.50)

## 2022-12-22 ENCOUNTER — Encounter: Payer: Self-pay | Admitting: Emergency Medicine

## 2022-12-22 ENCOUNTER — Emergency Department: Payer: Medicaid Other

## 2022-12-22 ENCOUNTER — Emergency Department
Admission: EM | Admit: 2022-12-22 | Discharge: 2022-12-22 | Disposition: A | Discharge status: Home / Self Care | Payer: Medicaid Other | Attending: Emergency Medicine | Admitting: Emergency Medicine

## 2022-12-22 ENCOUNTER — Other Ambulatory Visit: Payer: Self-pay

## 2022-12-22 DIAGNOSIS — R000 Tachycardia, unspecified: Secondary | ICD-10-CM | POA: Insufficient documentation

## 2022-12-22 DIAGNOSIS — D75839 Thrombocytosis, unspecified: Secondary | ICD-10-CM | POA: Diagnosis not present

## 2022-12-22 DIAGNOSIS — R0602 Shortness of breath: Secondary | ICD-10-CM | POA: Diagnosis present

## 2022-12-22 DIAGNOSIS — E119 Type 2 diabetes mellitus without complications: Secondary | ICD-10-CM | POA: Diagnosis not present

## 2022-12-22 DIAGNOSIS — I10 Essential (primary) hypertension: Secondary | ICD-10-CM | POA: Diagnosis not present

## 2022-12-22 DIAGNOSIS — D649 Anemia, unspecified: Secondary | ICD-10-CM | POA: Insufficient documentation

## 2022-12-22 LAB — CBC WITH DIFFERENTIAL/PLATELET
Abs Immature Granulocytes: 0.05 10*3/uL (ref 0.00–0.07)
Basophils Absolute: 0 10*3/uL (ref 0.0–0.1)
Basophils Relative: 0 %
Eosinophils Absolute: 0.2 10*3/uL (ref 0.0–0.5)
Eosinophils Relative: 3 %
HCT: 33.9 % — ABNORMAL LOW (ref 36.0–46.0)
Hemoglobin: 10.2 g/dL — ABNORMAL LOW (ref 12.0–15.0)
Immature Granulocytes: 1 %
Lymphocytes Relative: 17 %
Lymphs Abs: 1.7 10*3/uL (ref 0.7–4.0)
MCH: 23.9 pg — ABNORMAL LOW (ref 26.0–34.0)
MCHC: 30.1 g/dL (ref 30.0–36.0)
MCV: 79.4 fL — ABNORMAL LOW (ref 80.0–100.0)
Monocytes Absolute: 0.6 10*3/uL (ref 0.1–1.0)
Monocytes Relative: 6 %
Neutro Abs: 7.2 10*3/uL (ref 1.7–7.7)
Neutrophils Relative %: 73 %
Platelets: 640 10*3/uL — ABNORMAL HIGH (ref 150–400)
RBC: 4.27 MIL/uL (ref 3.87–5.11)
RDW: 15.6 % — ABNORMAL HIGH (ref 11.5–15.5)
WBC: 9.8 10*3/uL (ref 4.0–10.5)
nRBC: 0 % (ref 0.0–0.2)

## 2022-12-22 LAB — BASIC METABOLIC PANEL
Anion gap: 12 (ref 5–15)
BUN: 10 mg/dL (ref 6–20)
CO2: 25 mmol/L (ref 22–32)
Calcium: 8.9 mg/dL (ref 8.9–10.3)
Chloride: 100 mmol/L (ref 98–111)
Creatinine, Ser: 0.67 mg/dL (ref 0.44–1.00)
GFR, Estimated: >60 mL/min (ref >60)
Glucose, Bld: 105 mg/dL — ABNORMAL HIGH (ref 70–99)
Potassium: 3.8 mmol/L (ref 3.5–5.1)
Sodium: 137 mmol/L (ref 135–145)

## 2022-12-22 LAB — TROPONIN I (HIGH SENSITIVITY)
Troponin I (High Sensitivity): 8 ng/L (ref <18)
Troponin I (High Sensitivity): 8 ng/L (ref <18)

## 2022-12-22 LAB — BRAIN NATRIURETIC PEPTIDE: B Natriuretic Peptide: 46.1 pg/mL (ref 0.0–100.0)

## 2022-12-22 MED ORDER — IOHEXOL 350 MG/ML SOLN
100.0000 mL | Freq: Once | INTRAVENOUS | Status: AC | PRN
  Administered 2022-12-22: 100 mL via INTRAVENOUS

## 2022-12-22 NOTE — Discharge Instructions (Signed)
You were seen in the emergency room today for evaluation of your shortness of breath.  Fortunately your testing was overall reassuring.  Specifically your CT did not show evidence of a blood clot in your lungs.  Please follow-up with your primary care doctor for further evaluation of your anemia and shortness of breath and with your surgeon as directed regarding the swelling around your surgical site.  Return to the ER for new or worsening symptoms.

## 2022-12-22 NOTE — ED Triage Notes (Addendum)
Patient to ED via POV for abnormal labs- elevated d dimer (drawn yesterday). States her doctor was concerned for blood clots due to dyspnea with exertion. States hernia surgery last month. States she has also had increased edema in ankles and stomach. Pt states she stopped taking her fluid pills last week due to it "drying her out."

## 2022-12-22 NOTE — ED Provider Notes (Signed)
[facility] Provider Note    Event Date/Time   First MD Initiated Contact with Patient 12/22/22 2109     (approximate)   History   Abnormal Labs   HPI  Destiny Anderson is a 52 year old female with history of diabetes, hypertension presenting to the Emergency Department for evaluation of shortness of breath.  Had ventral hernia surgery repair at the end of September.  Since that time, she reports that she has been having shortness of breath that is worse when she is up and walking around.  No fevers or chills.  No cough or congestion.  She did have an outpatient D-dimer performed that returned elevated so she presented to the ER for further evaluation.  Denies any significant change in her symptoms over the past few days.  Does report that she was on Lasix for fluid buildup, but did not feel that it was effective so she discontinued this about a week ago.    Physical Exam   Triage Vital Signs: ED Triage Vitals  Encounter Vitals Group     BP 12/22/22 1850 (!) 163/61     Systolic BP Percentile --      Diastolic BP Percentile --      Pulse Rate 12/22/22 1850 (!) 119     Resp 12/22/22 1850 18     Temp 12/22/22 1850 98.9 F (37.2 C)     Temp Source 12/22/22 1850 Oral     SpO2 12/22/22 1850 96 %     Weight 12/22/22 1851 (!) 370 lb (167.8 kg)     Height 12/22/22 1851 5\' 2"  (1.575 m)     Head Circumference --      Peak Flow --      Pain Score 12/22/22 1850 0     Pain Loc --      Pain Education --      Exclude from Growth Chart --     Most recent vital signs: Vitals:   12/22/22 2234 12/22/22 2249  BP:  (!) 155/63  Pulse:  (!) 102  Resp:  20  Temp: 98.7 F (37.1 C) 98.3 F (36.8 C)  SpO2:  100%     General: Awake, interactive  CV:  Mild tachycardia with rhythm at the time of my initial evaluation. Resp:  Lungs clear to auscultation, respirations not significantly labored Abd:  Soft, there is a midline incision with leakage of nonpurulent  fluid that patient report surgeon is aware of, there is induration throughout her lower abdomen Neuro:  Symmetric facial movement, fluid speech   ED Results / Procedures / Treatments   Labs (all labs ordered are listed, but only abnormal results are displayed) Labs Reviewed  CBC WITH DIFFERENTIAL/PLATELET - Abnormal; Notable for the following components:      Result Value   Hemoglobin 10.2 (*)    HCT 33.9 (*)    MCV 79.4 (*)    MCH 23.9 (*)    RDW 15.6 (*)    Platelets 640 (*)    All other components within normal limits  BASIC METABOLIC PANEL - Abnormal; Notable for the following components:   Glucose, Bld 105 (*)    All other components within normal limits  BRAIN NATRIURETIC PEPTIDE  TROPONIN I (HIGH SENSITIVITY)  TROPONIN I (HIGH SENSITIVITY)     EKG EKG independently reviewed interpreted by myself (ER attending) demonstrates:  EKG demonstrate sinus tachycardia at a rate of 109, PR 144, cures 76, QTc 455, nonspecific ST changes  RADIOLOGY  Imaging independently reviewed and interpreted by myself demonstrates:  Chest x-Pauline Pegues without acute abnormality CT of the chest without evidence of PE or focal consolidation, radiology notes mild bilateral axillary lymphadenopathy, possibly reactive from recent surgery  PROCEDURES:  Critical Care performed: No  Procedures   MEDICATIONS ORDERED IN ED: Medications  iohexol (OMNIPAQUE) 350 MG/ML injection 100 mL (100 mLs Intravenous Contrast Given 12/22/22 2217)     IMPRESSION / MDM / ASSESSMENT AND PLAN / ED COURSE  I reviewed the triage vital signs and the nursing notes.  Differential diagnosis includes, but is not limited to, PE, pneumonia, pneumothorax, ACS, deconditioning postsurgery  Patient's presentation is most consistent with acute presentation with potential threat to life or bodily function.  52 year old female presenting to the emergency department for evaluation of shortness of breath with elevated outpatient  D-dimer.  Labs with stable anemia.  She does have new thrombocytosis at 640, possibly related to fairly recent surgery.  Chest x-Keslee Harrington reassuring.  CTA fortunately without evidence of PE.  Did discuss results of workup with patient.  Shortness of breath may be multifactorial including her anemia, deconditioning after surgery, discontinuing her use of Lasix.  Overall low suspicion for significant acute pathology.  Do think patient is stable for discharge home.  Patient comfortable this plan.  Strict return precautions righted.  Patient discharged in stable condition.    FINAL CLINICAL IMPRESSION(S) / ED DIAGNOSES   Final diagnoses:  Shortness of breath     Rx / DC Orders   ED Discharge Orders     None        Note:  This document was prepared using Dragon voice recognition software and may include unintentional dictation errors.   Trinna Post, MD 12/22/22 [phone]

## 2022-12-28 ENCOUNTER — Encounter: Payer: Self-pay | Admitting: Surgery

## 2022-12-28 ENCOUNTER — Other Ambulatory Visit: Payer: Self-pay | Admitting: Surgery

## 2022-12-28 DIAGNOSIS — S301XXD Contusion of abdominal wall, subsequent encounter: Secondary | ICD-10-CM

## 2022-12-28 NOTE — Progress Notes (Signed)
Patient for CT Abd Pelvis wo contrast & CT guided peritoneal/retroperitoneal fluid drain by perc cath on Wed 12/29/2022, I called and spoke with the patient on the phone and gave pre-procedure instructions. Pt was made aware to be here at 2:15p, NPO after MN prior to procedure as well as driver post procedure/recovery/discharge. Pt stated understanding.  Called   12/28/2022   May not need to use moderate sedation/ Dr Juliette Alcide will decided after he looks at pt's CT Abd Pelvis wo contrast.

## 2022-12-29 ENCOUNTER — Ambulatory Visit
Admission: RE | Admit: 2022-12-29 | Discharge: 2022-12-29 | Disposition: A | Discharge status: Home / Self Care | Payer: Medicaid Other | Source: Ambulatory Visit | Attending: Surgery | Admitting: Surgery

## 2022-12-29 ENCOUNTER — Other Ambulatory Visit: Payer: Self-pay | Admitting: Surgery

## 2022-12-29 DIAGNOSIS — S301XXD Contusion of abdominal wall, subsequent encounter: Secondary | ICD-10-CM | POA: Insufficient documentation

## 2022-12-29 DIAGNOSIS — X58XXXD Exposure to other specified factors, subsequent encounter: Secondary | ICD-10-CM | POA: Diagnosis not present

## 2022-12-29 DIAGNOSIS — Z4682 Encounter for fitting and adjustment of non-vascular catheter: Secondary | ICD-10-CM | POA: Diagnosis present

## 2022-12-29 DIAGNOSIS — K449 Diaphragmatic hernia without obstruction or gangrene: Secondary | ICD-10-CM | POA: Diagnosis not present

## 2022-12-29 MED ORDER — SODIUM CHLORIDE 0.9% FLUSH: 5.0000 mL | Freq: Three times a day (TID) | INTRAVENOUS | Status: DC

## 2022-12-29 MED ORDER — LIDOCAINE HCL (PF) 1 % IJ SOLN
10.0000 mL | Freq: Once | INTRAMUSCULAR | Status: AC
  Administered 2022-12-29: 10 mL via INTRADERMAL

## 2023-01-04 LAB — AEROBIC/ANAEROBIC CULTURE W GRAM STAIN (SURGICAL/DEEP WOUND)

## 2023-06-24 ENCOUNTER — Encounter: Payer: Self-pay | Admitting: Intensive Care

## 2023-06-24 ENCOUNTER — Emergency Department

## 2023-06-24 ENCOUNTER — Other Ambulatory Visit: Payer: Self-pay

## 2023-06-24 ENCOUNTER — Emergency Department
Admission: EM | Admit: 2023-06-24 | Discharge: 2023-06-24 | Disposition: A | Discharge status: Home / Self Care | Attending: Emergency Medicine | Admitting: Emergency Medicine

## 2023-06-24 DIAGNOSIS — E119 Type 2 diabetes mellitus without complications: Secondary | ICD-10-CM | POA: Insufficient documentation

## 2023-06-24 DIAGNOSIS — R102 Pelvic and perineal pain: Secondary | ICD-10-CM

## 2023-06-24 DIAGNOSIS — N390 Urinary tract infection, site not specified: Secondary | ICD-10-CM | POA: Diagnosis not present

## 2023-06-24 DIAGNOSIS — I10 Essential (primary) hypertension: Secondary | ICD-10-CM | POA: Insufficient documentation

## 2023-06-24 DIAGNOSIS — R300 Dysuria: Secondary | ICD-10-CM | POA: Diagnosis present

## 2023-06-24 LAB — CBC WITH DIFFERENTIAL/PLATELET
Abs Immature Granulocytes: 0.06 10*3/uL (ref 0.00–0.07)
Basophils Absolute: 0 10*3/uL (ref 0.0–0.1)
Basophils Relative: 0 %
Eosinophils Absolute: 0.1 10*3/uL (ref 0.0–0.5)
Eosinophils Relative: 1 %
HCT: 37.7 % (ref 36.0–46.0)
Hemoglobin: 11.6 g/dL — ABNORMAL LOW (ref 12.0–15.0)
Immature Granulocytes: 0 %
Lymphocytes Relative: 7 %
Lymphs Abs: 1 10*3/uL (ref 0.7–4.0)
MCH: 24.8 pg — ABNORMAL LOW (ref 26.0–34.0)
MCHC: 30.8 g/dL (ref 30.0–36.0)
MCV: 80.6 fL (ref 80.0–100.0)
Monocytes Absolute: 0.7 10*3/uL (ref 0.1–1.0)
Monocytes Relative: 5 %
Neutro Abs: 11.6 10*3/uL — ABNORMAL HIGH (ref 1.7–7.7)
Neutrophils Relative %: 87 %
Platelets: 378 10*3/uL (ref 150–400)
RBC: 4.68 MIL/uL (ref 3.87–5.11)
RDW: 16.8 % — ABNORMAL HIGH (ref 11.5–15.5)
WBC: 13.5 10*3/uL — ABNORMAL HIGH (ref 4.0–10.5)
nRBC: 0 % (ref 0.0–0.2)

## 2023-06-24 LAB — CBG MONITORING, ED: Glucose-Capillary: 291 mg/dL — ABNORMAL HIGH (ref 70–99)

## 2023-06-24 LAB — URINALYSIS, W/ REFLEX TO CULTURE (INFECTION SUSPECTED)
Bilirubin Urine: NEGATIVE
Glucose, UA: NEGATIVE mg/dL
Ketones, ur: 20 mg/dL — AB
Nitrite: POSITIVE — AB
Protein, ur: 100 mg/dL — AB
RBC / HPF: >50 RBC/hpf (ref 0–5)
Specific Gravity, Urine: 1.012 (ref 1.005–1.030)
Squamous Epithelial / HPF: 0 /HPF (ref 0–5)
WBC, UA: >50 WBC/hpf (ref 0–5)
pH: 6 (ref 5.0–8.0)

## 2023-06-24 LAB — COMPREHENSIVE METABOLIC PANEL WITH GFR
ALT: 15 U/L (ref 0–44)
AST: 19 U/L (ref 15–41)
Albumin: 3.5 g/dL (ref 3.5–5.0)
Alkaline Phosphatase: 81 U/L (ref 38–126)
Anion gap: 14 (ref 5–15)
BUN: 8 mg/dL (ref 6–20)
CO2: 25 mmol/L (ref 22–32)
Calcium: 9.1 mg/dL (ref 8.9–10.3)
Chloride: 96 mmol/L — ABNORMAL LOW (ref 98–111)
Creatinine, Ser: 0.57 mg/dL (ref 0.44–1.00)
GFR, Estimated: >60 mL/min (ref >60)
Glucose, Bld: 255 mg/dL — ABNORMAL HIGH (ref 70–99)
Potassium: 4.2 mmol/L (ref 3.5–5.1)
Sodium: 135 mmol/L (ref 135–145)
Total Bilirubin: 0.9 mg/dL (ref 0.0–1.2)
Total Protein: 7.6 g/dL (ref 6.5–8.1)

## 2023-06-24 LAB — LACTIC ACID, PLASMA
Lactic Acid, Venous: 2.8 mmol/L (ref 0.5–1.9)
Lactic Acid, Venous: 2 mmol/L (ref 0.5–1.9)

## 2023-06-24 MED ORDER — CEFUROXIME AXETIL 250 MG PO TABS: 250.0000 mg | ORAL_TABLET | Freq: Two times a day (BID) | ORAL | 0 refills | Status: AC

## 2023-06-24 MED ORDER — OXYCODONE-ACETAMINOPHEN 5-325 MG PO TABS
1.0000 | ORAL_TABLET | Freq: Once | ORAL | Status: AC
  Administered 2023-06-24: 1 via ORAL
  Filled 2023-06-24: qty 1

## 2023-06-24 MED ORDER — OXYCODONE HCL 5 MG PO TABS
5.0000 mg | ORAL_TABLET | Freq: Once | ORAL | Status: AC
  Administered 2023-06-24: 5 mg via ORAL
  Filled 2023-06-24: qty 1

## 2023-06-24 MED ORDER — OXYCODONE-ACETAMINOPHEN 5-325 MG PO TABS: 1.0000 | ORAL_TABLET | ORAL | 0 refills | Status: AC | PRN

## 2023-06-24 MED ORDER — LACTATED RINGERS IV BOLUS: 1000.0000 mL | Freq: Once | INTRAVENOUS | Status: DC

## 2023-06-24 MED ORDER — SODIUM CHLORIDE 0.9 % IV BOLUS
1000.0000 mL | Freq: Once | INTRAVENOUS | Status: AC
  Administered 2023-06-24: 1000 mL via INTRAVENOUS

## 2023-06-24 MED ORDER — ACETAMINOPHEN 325 MG PO TABS
650.0000 mg | ORAL_TABLET | Freq: Once | ORAL | Status: AC | PRN
  Administered 2023-06-24: 650 mg via ORAL
  Filled 2023-06-24: qty 2

## 2023-06-24 MED ORDER — SODIUM CHLORIDE 0.9 % IV SOLN
1.0000 g | Freq: Once | INTRAVENOUS | Status: AC
  Administered 2023-06-24: 1 g via INTRAVENOUS
  Filled 2023-06-24: qty 10

## 2023-06-24 NOTE — ED Provider Notes (Signed)
 [facility] Provider Note   Event Date/Time   First MD Initiated Contact with Patient 06/24/23 1743     (approximate) History  Dysuria and Pelvic Pain  HPI Destiny Anderson is a 53 y.o. female with a past medical history of type 2 diabetes, hypertension, depression, and morbid obesity who presents complaining of dysuria and suprapubic pressure over the past week that has been acutely worsening over the past 3 days.  Patient now endorses low back pain and fever as well over the last 24 hours.  Patient denies any exacerbating or relieving factors ROS: Patient currently denies any vision changes, tinnitus, difficulty speaking, facial droop, sore throat, chest pain, shortness of breath, nausea/vomiting/diarrhea, or weakness/numbness/paresthesias in any extremity   Physical Exam  Triage Vital Signs: ED Triage Vitals [06/24/23 1459]  Encounter Vitals Group     BP (!) 153/72     Systolic BP Percentile      Diastolic BP Percentile      Pulse Rate (!) 121     Resp 20     Temp (!) 100.6 F (38.1 C)     Temp Source Oral     SpO2 94 %     Weight (!) 385 lb (174.6 kg)     Height 5\' 1"  (1.549 m)     Head Circumference      Peak Flow      Pain Score 8     Pain Loc      Pain Education      Exclude from Growth Chart    Most recent vital signs: Vitals:   06/24/23 1932 06/24/23 2000  BP:  (!) 157/75  Pulse:  (!) 127  Resp:    Temp: 100.1 F (37.8 C)   SpO2:  99%   General: Awake, oriented x4. CV:  Good peripheral perfusion.  Resp:  Normal effort.  Abd:  No distention.  Other:  Morbidly obese middle-aged Caucasian female resting comfortably in no acute distress ED Results / Procedures / Treatments  Labs (all labs ordered are listed, but only abnormal results are displayed) Labs Reviewed  LACTIC ACID, PLASMA - Abnormal; Notable for the following components:      Result Value   Lactic Acid, Venous 2.8 (*)    All other components within normal limits   COMPREHENSIVE METABOLIC PANEL WITH GFR - Abnormal; Notable for the following components:   Chloride 96 (*)    Glucose, Bld 255 (*)    All other components within normal limits  CBC WITH DIFFERENTIAL/PLATELET - Abnormal; Notable for the following components:   WBC 13.5 (*)    Hemoglobin 11.6 (*)    MCH 24.8 (*)    RDW 16.8 (*)    Neutro Abs 11.6 (*)    All other components within normal limits  URINALYSIS, W/ REFLEX TO CULTURE (INFECTION SUSPECTED) - Abnormal; Notable for the following components:   Color, Urine AMBER (*)    APPearance CLOUDY (*)    Hgb urine dipstick LARGE (*)    Ketones, ur 20 (*)    Protein, ur 100 (*)    Nitrite POSITIVE (*)    Leukocytes,Ua LARGE (*)    Bacteria, UA MANY (*)    All other components within normal limits  URINE CULTURE  LACTIC ACID, PLASMA   RADIOLOGY ED MD interpretation: 2 view chest x-ray interpreted by me shows no evidence of acute abnormalities including no pneumonia, pneumothorax, or widened mediastinum -Agree with radiology assessment Official radiology report(s): DG Chest  2 View Result Date: 06/24/2023 CLINICAL DATA:  Fever. EXAM: CHEST - 2 VIEW COMPARISON:  December 22, 2022. FINDINGS: The heart size and mediastinal contours are within normal limits. Both lungs are clear. The visualized skeletal structures are unremarkable. IMPRESSION: No active cardiopulmonary disease. Electronically Signed   By: Rosalene Colon M.D.   On: 06/24/2023 15:41   PROCEDURES: Critical Care performed: No Procedures MEDICATIONS ORDERED IN ED: Medications  acetaminophen  (TYLENOL ) tablet 650 mg (650 mg Oral Given 06/24/23 1505)  cefTRIAXone (ROCEPHIN) 1 g in sodium chloride  0.9 % 100 mL IVPB (1 g Intravenous New Bag/Given 06/24/23 1816)  sodium chloride  0.9 % bolus 1,000 mL (1,000 mLs Intravenous New Bag/Given 06/24/23 1816)   IMPRESSION / MDM / ASSESSMENT AND PLAN / ED COURSE  I reviewed the triage vital signs and the nursing notes.                              The patient is on the cardiac monitor to evaluate for evidence of arrhythmia and/or significant heart rate changes. Patient's presentation is most consistent with acute presentation with potential threat to life or bodily function. Not Pregnant. Unlikely TOA, Ovarian Torsion, PID, gonorrhea/chlamydia. Low suspicion for Infected Urolithiasis, AAA, Cholecystitis, Pancreatitis, SBO, Appendicitis, or other acute abdomen.  Rx: Cefdinir 300 mg BID for 5 days Disposition: Discharge home. SRP discussed. Advise follow up with primary care provider within 24-72 hours.   FINAL CLINICAL IMPRESSION(S) / ED DIAGNOSES   Final diagnoses:  Urinary tract infection with hematuria, site unspecified  Pelvic pain   Rx / DC Orders   ED Discharge Orders     None      Note:  This document was prepared using Dragon voice recognition software and may include unintentional dictation errors.   Miya Luviano K, MD 06/24/23 2022

## 2023-06-24 NOTE — ED Notes (Signed)
 Pt reporting significant discomfort from BP cuff. Unable to get a BP at this time due to pt's intolerance.

## 2023-06-24 NOTE — ED Triage Notes (Signed)
 C/o burning during urination a week ago. Took OTC with little relief. Having pressure in pelvic area and lower back. Fever present

## 2023-06-24 NOTE — ED Notes (Signed)
 Patients fluid bolus noted to not be infusing. RN flushed line and reestablished IV patency. Will collect lactic acid once fluid bolus has been administered.

## 2023-06-26 LAB — URINE CULTURE: Culture: >=100000 — AB

## 2024-01-12 ENCOUNTER — Encounter: Payer: Self-pay | Admitting: Surgery
